# Patient Record
Sex: Male | Born: 1965 | Race: White | Hispanic: No | Marital: Single | Smoking: Current every day smoker
Health system: Southern US, Community
[De-identification: ages and names within clinical notes are randomized; demographics above are authoritative.]

## PROBLEM LIST (undated history)

## (undated) DIAGNOSIS — R569 Unspecified convulsions: Secondary | ICD-10-CM

## (undated) DIAGNOSIS — A879 Viral meningitis, unspecified: Secondary | ICD-10-CM

---

## 1998-09-01 ENCOUNTER — Encounter: Payer: Self-pay | Admitting: *Deleted

## 1998-09-01 ENCOUNTER — Emergency Department (HOSPITAL_COMMUNITY): Admission: EM | Admit: 1998-09-01 | Discharge: 1998-09-01 | Payer: Self-pay | Admitting: *Deleted

## 1998-09-03 ENCOUNTER — Ambulatory Visit (HOSPITAL_COMMUNITY): Admission: RE | Admit: 1998-09-03 | Discharge: 1998-09-03 | Payer: Self-pay | Admitting: *Deleted

## 1998-09-03 ENCOUNTER — Encounter: Payer: Self-pay | Admitting: *Deleted

## 1998-09-25 ENCOUNTER — Emergency Department (HOSPITAL_COMMUNITY): Admission: EM | Admit: 1998-09-25 | Discharge: 1998-09-25 | Payer: Self-pay | Admitting: Emergency Medicine

## 1998-09-26 ENCOUNTER — Inpatient Hospital Stay (HOSPITAL_COMMUNITY): Admission: AD | Admit: 1998-09-26 | Discharge: 1998-10-04 | Payer: Self-pay | Admitting: Psychiatry

## 1998-10-07 ENCOUNTER — Encounter (HOSPITAL_COMMUNITY): Admission: RE | Admit: 1998-10-07 | Discharge: 1999-01-05 | Payer: Self-pay | Admitting: Psychiatry

## 2001-02-26 ENCOUNTER — Emergency Department (HOSPITAL_COMMUNITY): Admission: EM | Admit: 2001-02-26 | Discharge: 2001-02-26 | Payer: Self-pay | Admitting: Emergency Medicine

## 2001-02-26 ENCOUNTER — Encounter: Payer: Self-pay | Admitting: Emergency Medicine

## 2003-07-11 ENCOUNTER — Emergency Department (HOSPITAL_COMMUNITY): Admission: EM | Admit: 2003-07-11 | Discharge: 2003-07-11 | Payer: Self-pay | Admitting: Emergency Medicine

## 2004-05-18 ENCOUNTER — Emergency Department (HOSPITAL_COMMUNITY): Admission: EM | Admit: 2004-05-18 | Discharge: 2004-05-19 | Payer: Self-pay | Admitting: Emergency Medicine

## 2004-05-22 ENCOUNTER — Ambulatory Visit (HOSPITAL_COMMUNITY): Admission: RE | Admit: 2004-05-22 | Discharge: 2004-05-22 | Payer: Self-pay | Admitting: Urology

## 2004-08-24 ENCOUNTER — Emergency Department (HOSPITAL_COMMUNITY): Admission: EM | Admit: 2004-08-24 | Discharge: 2004-08-24 | Payer: Self-pay | Admitting: Emergency Medicine

## 2004-09-18 ENCOUNTER — Ambulatory Visit: Payer: Self-pay | Admitting: Internal Medicine

## 2005-04-25 ENCOUNTER — Emergency Department (HOSPITAL_COMMUNITY): Admission: EM | Admit: 2005-04-25 | Discharge: 2005-04-25 | Payer: Self-pay | Admitting: Emergency Medicine

## 2006-04-09 ENCOUNTER — Emergency Department (HOSPITAL_COMMUNITY): Admission: EM | Admit: 2006-04-09 | Discharge: 2006-04-09 | Payer: Self-pay | Admitting: Emergency Medicine

## 2008-03-18 ENCOUNTER — Inpatient Hospital Stay (HOSPITAL_COMMUNITY): Admission: EM | Admit: 2008-03-18 | Discharge: 2008-03-21 | Payer: Self-pay | Admitting: Emergency Medicine

## 2008-03-18 ENCOUNTER — Ambulatory Visit: Payer: Self-pay | Admitting: Internal Medicine

## 2008-03-19 ENCOUNTER — Encounter (INDEPENDENT_AMBULATORY_CARE_PROVIDER_SITE_OTHER): Payer: Self-pay | Admitting: Hospitalist

## 2009-03-21 ENCOUNTER — Emergency Department (HOSPITAL_COMMUNITY): Admission: EM | Admit: 2009-03-21 | Discharge: 2009-03-21 | Payer: Self-pay | Admitting: Emergency Medicine

## 2011-03-03 NOTE — Discharge Summary (Signed)
NAMEMANLEY, FASON              ACCOUNT NO.:  [id number]   MEDICAL RECORD NO.:  [id number]          PATIENT TYPE:  INP   LOCATION:  3715                         FACILITY:  [facility]   PHYSICIAN:  Eliseo Gum, M.D.   DATE OF BIRTH:  1966-04-30   DATE OF ADMISSION:  03/18/2008  DATE OF DISCHARGE:  03/21/2008                               DISCHARGE SUMMARY   DISCHARGE DIAGNOSES:  1. Cardiomyopathy, ejection fraction 50%, mixed etiology.  2. Coronary artery disease.  3. History of polysubstance abuse.  4. History of alcohol abuse.  5. Tobacco abuse.  6. Left upper extremity weakness, resolved, normal MRI uptake.  7. Bilateral periorbital preseptal cellulitis.  8. Hypokalemia, resolved.  9. History of kidney disease.  10.History of chronic back pain secondary to a herniated disk.   DISCHARGE MEDICATIONS:  1. Aspirin 81 mg daily.  2. Zocor 20 mg daily.  3. Doxycycline 100 mg 3 times a day for 5 days.  4. Ciprofloxacin 500 mg twice a day for 5 days.   DISPOSITION AND FOLLOWUP:  The patient is sent home in a stable  condition.  The patient is advised to complete the antibiotic course for  5 days.  The patient advised to follow with his ophthalmologist and  refrain from using his contact lens until he is reviewed by his eye  doctor.  The patient will be follow up at the [facility] in terms of his primary care.  Followup appointment  has been arranged for April 03, 2008, with Dr. Polly Cobia.  At the followup  visit, the patient will be reviewed for resolution of his chest pain and  his primary care needs will be addressed.   PROCEDURES:  1. Chest x-ray, Mar 18, 2008.  Impression:  Low lung volume  2. CT of orbit/temporal with contrast. Impression:  No intraorbital      pathology identified.  Findings compatible with right periorbital      cellulitis.  No postseptal involvement.  3. MRI brain and C-spine without contrast of March 19, 2008.      Impression:  No  acute intracranial findings.  Mild multilevel      spondylosis.  4. Cardiac catheterization on March 21, 2008.  Findings:  Ejection      fraction 50%, mild global hypokinesia, mild diffuse disease of LAD   CONSULTS:  Dr. Mariah Milling of [facility].   ADMISSION HISTORY:  The patient is a 45 year old male with past medical  history significant for cocaine abuse, strong family history of coronary  artery disease, tobacco abuse, and heartburn who presented to the [facility] ED with complaint of crushing chest pain that started at  approximately 7 a.m. on the day of admission.  It happened after the  patient used crack cocaine.  History was given by the patient's mother.  The patient states that the pain was crushing, severe with radiation  down the left arm and into the jaw.  This pain was associated with  nausea and vomiting, dizziness, climate change per mother.  The patient  has had recent episodes of dizziness and  heartburn.  The patient's  mother reported that the patient also had increased cough and dizziness  that is not better with food.   ADMISSION PHYSICAL:  VITAL SIGNS:  Temperature 97.1, blood pressure  101/66, pulse 87, respiratory rate 18, and oxygen saturation 99% on room  air.  GENERAL:  The patient was drowsing having received Ativan, was unable to  open his eyes secondary to periorbital cellulitis.  EYES:  Bilateral erythematous conjunctivae with periorbital cellulitis.  ENT:  Moist mucous membranes with clear oropharynx.  NECK:  Supple with no carotid bruits.  CHEST:  Clear to auscultation bilaterally.  CARDIOVASCULAR:  Normal heart sounds.  Regular rate and rhythm.  No  murmur, rub, or gallop.  ABDOMEN:  Soft.  Positive bowel sounds, obese, nontender.  EXTREMITIES:  No edema.  SKIN:  Normal turgor.  No rashes.  LYMPHS:  No lymphadenopathy.  NEURO:  Limited exam secondary to Ativan.  Moving all 4 extremities.  Sensation is intact to light touch.    ADMISSION LABS:  Sodium 134, potassium 3.3, chloride 101, bicarbonate  36, BUN 9, creatinine 0.86, blood glucose 108.  Hemoglobin 15.2, MCV  19.3, WBC 9.4, ALT 6.8, and platelets 274,000.  INR 1.0.  Cardiac  enzymes and troponin less than 0.05.   HOSPITAL COURSE BY PROBLEMS:  1. Chest pain.  The patient did have significant risk factors for      acute coronary syndrome, coronary artery disease, and hence was      admitted for further workup and monitored.  He was placed on      telemetry floor and electrocardiogram were repeated and cardiac      enzymes were cycled.  He did not have any acute changes on the      EKGs, did not find ischemia and also cardiac enzymes were not      elevated, but given significant risk factor of a polysubstance      abuse, smoking, and family history of coronary artery disease, a      cardiac consultation was requested.  The patient was taken for a      stress Myoview examination, but on account of ongoing chest pain      that was cancelled and the patient subsequently underwent cardiac      catheterization March 21, 2008.  Findings are mentioned as above.      Following cardiac catheterization, cardiologist's recommendation      was to place the patient on statin and aspirin which were initiated      and the patient was sent home.  2. Periorbital cellulitis.  A CT scan of the head was done to rule out      intraorbital and intracranial extension of the infection.  The      patient was placed on vancomycin and Zosyn initially to follow up      for MRSA and Pseudomonas.  The patient is a contact lens user.  The      patient did not tolerate vancomycin, and developed itching and      rashes and hence was switched to doxycycline and Zosyn, followed by      doxycycline and ciprofloxacin p.o. upon discharge.  The patient      responded well to the antibiotic treatment.  He have also given      ofloxacin eye drops.  3. Left upper extremity weakness.  The patient did  not exhibit left      upper extremity weakness while in the hospital.  An MRI examination      was done to rule out acute stroke.  The MRI examination was normal,      and the left upper extremity weakness resolved completely.  4. Tobacco abuse.  The patient was given smoking cessation counseling.  5. Polysubstance abuse.  Urine drug screen was positive for opiates      and cocaine.  The patient was counseled on the need to remain off      the harmful drugs.  6. Hypokalemia, potassium was repleted.   DISCHARGE DAY LABS:  Sodium 139,  potassium 3.5, chloride 106, bicarb  27, BUN 6, creatinine 0.93, and glucose 156.  Hemoglobin 14.5, WBC 6.9,  platelets 234, and MCV 90.   DISCHARGE DAY VITALS:  Blood pressure 108/65, heart rate 87,  respirations 18, temperature 98.8, and oxygen saturation 97% on 2 L.   On the day of discharge, the patient was back to his baseline in terms  of functional status.  He was not complaining of any chest pain and he  was not complaining of any weakness.  His chest examination was  completely benign and neuro examination was completely nonfocal.      Zara Council, MD  Electronically Signed      Eliseo Gum, M.D.  Electronically Signed    AS/MEDQ  D:  03/21/2008  T:  03/22/2008  Job:  [id number]

## 2011-03-03 NOTE — Cardiovascular Report (Signed)
Bruce Wells, Bruce Wells              ACCOUNT NO.:  [id number]   MEDICAL RECORD NO.:  [id number]          PATIENT TYPE:  INP   LOCATION:  3715                         FACILITY:  [facility]   PHYSICIAN:  Antonieta Iba, MD   DATE OF BIRTH:  08/31/1966   DATE OF PROCEDURE:  03/21/2008  DATE OF DISCHARGE:  03/21/2008                            CARDIAC CATHETERIZATION   PHYSICIAN PERFORMED THE PROCEDURE:  Antonieta Iba, MD.   REASON FOR PROCEDURE:  Bruce Wells is a 45 year old gentleman with strong  family history of coronary artery disease with past medical history of  crack cocaine use and chronic back pain secondary to herniated disk, who  presented with chest pain, nausea, vomiting, and diaphoresis in the  setting of recent crack cocaine use.  He also reports having left arm  numbness and pain radiating to the left jaw.  Initial presentation was  on Mar 18, 2008.  His cardiac enzymes were negative.  He was sent for  cardiac catheterization given his presentation and strong family history  of coronary artery disease.   PROCEDURE:  Details of the risks and benefits of the procedure were  detailed to Bruce Wells and consent was obtained.  He was brought to the  cardiac catheterization lab and prepped and draped in the usual sterile  fashion.  The right and left groin were prepped and right femoral  arterial access was obtained using modified Seldinger technique.  A 5  French introducer sheath was inserted into the femoral artery.  A  Judkins left #4 catheter 5-French and a Judkins right #4 catheter 5-  [place] were used to evaluate the left main and right coronary arteries  respectively.  A non-torque 5-French Judkins catheter was used secondary  to difficulty engaging the ostium of the RCA.  Hand injection of  contrast was performed with fluoroscopy and images were evaluated.  At  the end of the procedure, the catheter was removed and hemostasis was  obtained with pressure.  No  complications were reported.   CORONARY ANATOMY DETAILS:  1. Left main;  Left main is a moderate-sized vessel that bifurcates      into the LAD and left circumflex artery.  It is a moderate-to-large      size vessel that is short in nature.  There is no significant      disease noted.  2. Left anterior descending; the LAD is a moderate-to-large size      vessel that extends distally to the apex.  There are 2 moderate-      sized diagonal vessels that takeoff proximally.  There is mild 20-      30 diffuse disease of the mid LAD after the takeoff of the second      diagonal.  This region is small in caliber than the proximal and      even the further distal LAD region.  No other significant disease      is noted in the diagonals or mid to distal LAD.  3. Left circumflex; the left circumflex is a moderate-sized codominant      vessel that has  several obtuse marginal branches of moderate size.      There is no significant disease noted.  4. Right coronary artery; the RCA was difficult to evaluate, though      nonselective shots clearly showed no significant coronary disease.      It is of moderate size, though caliber is on the smaller side.      There is a visible PDA and PL branch.  There is no significant      disease noted.   LV gram shows low normal systolic function with mild global hypokinesis  and ejection fraction estimated at 50%.  There is no evidence of  significant aortic stenosis or mitral regurgitation.   SUMMARY:  Codominant coronary system. Mild diffuse disease of the mid  LAD with small caliber vessel in this region, otherwise no significant  coronary artery disease. Low normal systolic function with estimated  ejection fraction of 50%.  No significant aortic stenosis noted.  Etiology of the patient's symptoms is likely secondary to cocaine and  possible coronary spasm.      Antonieta Iba, MD  Electronically Signed     TJG/MEDQ  D:  03/21/2008  T:   03/22/2008  Job:  [id number]

## 2011-07-15 LAB — BASIC METABOLIC PANEL
BUN: 9
Calcium: 9.1
Chloride: 101
Creatinine, Ser: 0.86
GFR calc Af Amer: >60
GFR calc non Af Amer: >60
Sodium: 134 — ABNORMAL LOW

## 2011-07-15 LAB — CBC
Hemoglobin: 15.2
MCV: 90.3
RBC: 4.83

## 2011-07-15 LAB — COMPREHENSIVE METABOLIC PANEL
Alkaline Phosphatase: 54
CO2: 26
Chloride: 104
Creatinine, Ser: 0.86
GFR calc Af Amer: >60
GFR calc non Af Amer: >60
Glucose, Bld: 106 — ABNORMAL HIGH
Potassium: 3.3 — ABNORMAL LOW
Sodium: 135
Total Protein: 5.8 — ABNORMAL LOW

## 2011-07-15 LAB — RAPID URINE DRUG SCREEN, HOSP PERFORMED
Amphetamines: NOT DETECTED
Barbiturates: NOT DETECTED
Opiates: POSITIVE — AB

## 2011-07-15 LAB — CK TOTAL AND CKMB (NOT AT ARMC)
CK, MB: 1
Relative Index: INVALID

## 2011-07-15 LAB — POCT CARDIAC MARKERS
CKMB, poc: <1 — ABNORMAL LOW
Operator id: 265201

## 2011-07-15 LAB — APTT: aPTT: 29

## 2011-07-15 LAB — HEMOGLOBIN A1C: Hgb A1c MFr Bld: 5.6

## 2011-07-15 LAB — DIFFERENTIAL
Eosinophils Relative: 3
Lymphocytes Relative: 15
Lymphs Abs: 1.4

## 2011-07-15 LAB — MAGNESIUM: Magnesium: 2.3

## 2011-07-15 LAB — URINALYSIS, ROUTINE W REFLEX MICROSCOPIC
Hgb urine dipstick: NEGATIVE
Nitrite: NEGATIVE
Specific Gravity, Urine: 1.017
Urobilinogen, UA: 1

## 2011-07-15 LAB — PROTIME-INR: INR: 1

## 2011-07-15 LAB — CARDIAC PANEL(CRET KIN+CKTOT+MB+TROPI)
Relative Index: INVALID
Troponin I: 0.02

## 2011-07-16 LAB — CBC
HCT: 41.8
HCT: 44.6
Hemoglobin: 14.5
Hemoglobin: 15
MCHC: 34.7
MCHC: 34.7
MCV: 90.3
Platelets: 234
Platelets: 234
RBC: 4.63
RDW: 13
WBC: 5.9
WBC: 6.9

## 2011-07-16 LAB — BASIC METABOLIC PANEL
BUN: 5 — ABNORMAL LOW
BUN: 6
CO2: 27
Calcium: 8.8
Chloride: 106
Chloride: 109
Creatinine, Ser: 0.93
GFR calc Af Amer: >60
GFR calc Af Amer: >60
GFR calc non Af Amer: >60
GFR calc non Af Amer: >60
GFR calc non Af Amer: >60
Potassium: 3.5
Potassium: 3.7
Potassium: 3.8
Potassium: 4.2
Sodium: 137
Sodium: 140
Sodium: 142

## 2011-07-16 LAB — CARDIAC PANEL(CRET KIN+CKTOT+MB+TROPI)
CK, MB: 0.7
Total CK: 53
Troponin I: <0.01

## 2011-07-16 LAB — LIPID PANEL
Total CHOL/HDL Ratio: 7.7
VLDL: 55 — ABNORMAL HIGH

## 2013-01-15 ENCOUNTER — Encounter (HOSPITAL_COMMUNITY): Payer: Self-pay | Admitting: Nurse Practitioner

## 2013-01-15 ENCOUNTER — Emergency Department (HOSPITAL_COMMUNITY)
Admission: EM | Admit: 2013-01-15 | Discharge: 2013-01-15 | Disposition: A | Discharge status: Home / Self Care | Payer: Self-pay | Attending: Emergency Medicine | Admitting: Emergency Medicine

## 2013-01-15 ENCOUNTER — Emergency Department (HOSPITAL_COMMUNITY): Payer: Self-pay

## 2013-01-15 DIAGNOSIS — R109 Unspecified abdominal pain: Secondary | ICD-10-CM

## 2013-01-15 DIAGNOSIS — R079 Chest pain, unspecified: Secondary | ICD-10-CM | POA: Insufficient documentation

## 2013-01-15 DIAGNOSIS — R05 Cough: Secondary | ICD-10-CM | POA: Insufficient documentation

## 2013-01-15 DIAGNOSIS — F17200 Nicotine dependence, unspecified, uncomplicated: Secondary | ICD-10-CM | POA: Insufficient documentation

## 2013-01-15 DIAGNOSIS — R059 Cough, unspecified: Secondary | ICD-10-CM | POA: Insufficient documentation

## 2013-01-15 DIAGNOSIS — R1011 Right upper quadrant pain: Secondary | ICD-10-CM | POA: Insufficient documentation

## 2013-01-15 LAB — COMPREHENSIVE METABOLIC PANEL
AST: 27 U/L (ref 0–37)
Albumin: 4.2 g/dL (ref 3.5–5.2)
Alkaline Phosphatase: 63 U/L (ref 39–117)
BUN: 10 mg/dL (ref 6–23)
Chloride: 98 mEq/L (ref 96–112)
Potassium: 3.8 mEq/L (ref 3.5–5.1)
Total Bilirubin: 0.4 mg/dL (ref 0.3–1.2)
Total Protein: 7.6 g/dL (ref 6.0–8.3)

## 2013-01-15 LAB — CBC WITH DIFFERENTIAL/PLATELET
Eosinophils Absolute: 0.2 10*3/uL (ref 0.0–0.7)
Eosinophils Relative: 2 % (ref 0–5)
HCT: 45.6 % (ref 39.0–52.0)
Lymphocytes Relative: 17 % (ref 12–46)
Lymphs Abs: 1.8 10*3/uL (ref 0.7–4.0)
MCH: 31.3 pg (ref 26.0–34.0)
MCV: 87.5 fL (ref 78.0–100.0)
Monocytes Absolute: 0.8 10*3/uL (ref 0.1–1.0)
Platelets: 317 10*3/uL (ref 150–400)
RBC: 5.21 MIL/uL (ref 4.22–5.81)
RDW: 13.9 % (ref 11.5–15.5)
WBC: 10.8 10*3/uL — ABNORMAL HIGH (ref 4.0–10.5)

## 2013-01-15 LAB — URINALYSIS, MICROSCOPIC ONLY
Bilirubin Urine: NEGATIVE
Glucose, UA: NEGATIVE mg/dL
Hgb urine dipstick: NEGATIVE
Ketones, ur: NEGATIVE mg/dL
Nitrite: NEGATIVE
Specific Gravity, Urine: 1.019 (ref 1.005–1.030)
pH: 7.5 (ref 5.0–8.0)

## 2013-01-15 MED ORDER — HYDROMORPHONE HCL PF 1 MG/ML IJ SOLN
1.0000 mg | Freq: Once | INTRAMUSCULAR | Status: AC
  Administered 2013-01-15: 1 mg via INTRAVENOUS
  Filled 2013-01-15: qty 1

## 2013-01-15 MED ORDER — ONDANSETRON HCL 4 MG/2ML IJ SOLN
4.0000 mg | Freq: Once | INTRAMUSCULAR | Status: AC
  Administered 2013-01-15: 4 mg via INTRAVENOUS
  Filled 2013-01-15: qty 2

## 2013-01-15 MED ORDER — IBUPROFEN 600 MG PO TABS: 600.0000 mg | ORAL_TABLET | Freq: Three times a day (TID) | ORAL | Status: DC | PRN

## 2013-01-15 MED ORDER — SODIUM CHLORIDE 0.9 % IV SOLN: 1000.0000 mL | INTRAVENOUS | Status: DC

## 2013-01-15 MED ORDER — SODIUM CHLORIDE 0.9 % IV SOLN
1000.0000 mL | Freq: Once | INTRAVENOUS | Status: AC
  Administered 2013-01-15: 1000 mL via INTRAVENOUS

## 2013-01-15 MED ORDER — HYDROCODONE-ACETAMINOPHEN 5-325 MG PO TABS: 1.0000 | ORAL_TABLET | ORAL | Status: DC | PRN

## 2013-01-15 NOTE — ED Provider Notes (Signed)
History     CSN: [id number]  Arrival date & time 01/15/13  1047   First MD Initiated Contact with Patient 01/15/13 1326      Chief Complaint  Patient presents with  . Abdominal Pain    HPI The patient reports 10 days of right-sided chest pain is worse with deep breathing and coughing.  He states his pain became severe today when he coughed very hard and had significant right-sided chest pain.  He reports some nausea over the past several days without vomiting.  No fevers or chills.  No productive cough.  He has no history of gallstones.  Is there history of COPD.  He continues to smoke cigarettes.  No history of DVT or pulmonary embolism.  No cardiac history.  He states his symptoms are mild to moderate in severity and worsened with coughing at which point the pain becomes severe.  No rash noted.   History reviewed. No pertinent past medical history.  History reviewed. No pertinent past surgical history.  History reviewed. No pertinent family history.  History  Substance Use Topics  . Smoking status: Current Every Day Smoker  . Smokeless tobacco: Not on file  . Alcohol Use: No      Review of Systems  All other systems reviewed and are negative.    Allergies  Sulfa antibiotics  Home Medications  No current outpatient prescriptions on file.  BP 120/77  Pulse 81  Temp(Src) 97.9 F (36.6 C) (Oral)  Resp 20  SpO2 98%  Physical Exam  Nursing note and vitals reviewed. Constitutional: He is oriented to person, place, and time. He appears well-developed and well-nourished.  HENT:  Head: Normocephalic and atraumatic.  Eyes: EOM are normal.  Neck: Normal range of motion.  Cardiovascular: Normal rate, regular rhythm, normal heart sounds and intact distal pulses.   Pulmonary/Chest: Effort normal and breath sounds normal. No respiratory distress. He exhibits no tenderness.  Right anterolateral chest wall tenderness without ecchymosis.  Abdominal: Soft. He exhibits no  distension and no mass.  Right upper quadrant tenderness without guarding or rebound, very mild  Musculoskeletal: Normal range of motion.  Neurological: He is alert and oriented to person, place, and time.  Skin: Skin is warm and dry.  Psychiatric: He has a normal mood and affect. Judgment normal.    ED Course  Procedures (including critical care time)  Labs Reviewed  COMPREHENSIVE METABOLIC PANEL - Abnormal; Notable for the following:    Sodium 134 (*)    Glucose, Bld 104 (*)    All other components within normal limits  CBC WITH DIFFERENTIAL - Abnormal; Notable for the following:    WBC 10.8 (*)    Neutro Abs 8.0 (*)    All other components within normal limits  LIPASE, BLOOD  URINALYSIS, MICROSCOPIC ONLY   Dg Chest 2 View  01/15/2013  *RADIOLOGY REPORT*  Clinical Data: Right chest pain, cough  CHEST - 2 VIEW  Comparison: 03/18/2008  Findings: Normal heart size and vascularity.  Minimal basilar atelectasis.  No CHF, pneumonia, effusion or pneumothorax.  Trachea midline.  IMPRESSION: Basilar atelectasis.  No acute finding.   Original Report Authenticated By: Judie Petit. Miles Costain, M.D.    US Abdomen Complete  01/15/2013  *RADIOLOGY REPORT*  Clinical Data:  Abdominal pain.  COMPLETE ABDOMINAL ULTRASOUND  Comparison:  Abdominal CT 04/09/2006  Findings:  Gallbladder:  No gallstones, gallbladder wall thickening, or pericholecystic fluid.  Common bile duct:  Measures 4 mm.  Liver:  The liver parenchyma is slightly  echogenic with loss of internal architecture. Findings suggest hepatic steatosis.  IVC:  Not visualized.  Pancreas:  Not visualized.  Spleen:  Measures 6.9 cm in length.  Right Kidney:  Right kidney measures 12.6 cm in length without hydronephrosis.  Left Kidney:  Left kidney measures 13.1 cm without hydronephrosis.  Abdominal aorta:  The mid and distal aorta are not visualized.  IMPRESSION:  No acute abnormalities.  The liver parenchyma is slightly echogenic.  Findings can be associated with  hepatic steatosis.   Original Report Authenticated By: Richarda Overlie, M.D.    I personally reviewed the imaging tests through PACS system I reviewed available ER/hospitalization records through the EMR   1. Abdominal pain       MDM  Chest x-ray and ultrasound.  This may represent cholecystitis given the severity of his upper quadrant pain.  Vital signs are normal.  Pain will be treated.  Labs pending.  May also represent chest wall pain secondary to coughing.  4:01 PM Patient feels much better this time.  He has no gallstones.  His vital signs are normal.  No hypoxia or tachycardia.  Her represent pleurisy versus chest wall pain from coughing.  Doubt pulmonary embolism.  With pain medicine and followup with his primary care physician .  He understands return to the ER for new or worsening symptoms        Lyanne Co, MD 01/15/13 [phone]

## 2013-01-15 NOTE — ED Notes (Addendum)
Pt reports RUQ/R side pain for past 10 days. Today he was having a BM and he felt a "pop" in this area, states "It felt like something ripped." c/o severe pain and nausea now. No v/d. A&Ox4.

## 2013-09-26 IMAGING — US US ABDOMEN COMPLETE
1 series · 14 of 25 positions shown · non-contrast
Comparison: Abdominal CT 04/09/2006

CLINICAL DATA: Abdominal pain.

COMPLETE ABDOMINAL ULTRASOUND

[Series 1: us abdomen complete · 0.33mm/px · 14 of 53 slices shown]
[im 1/53]
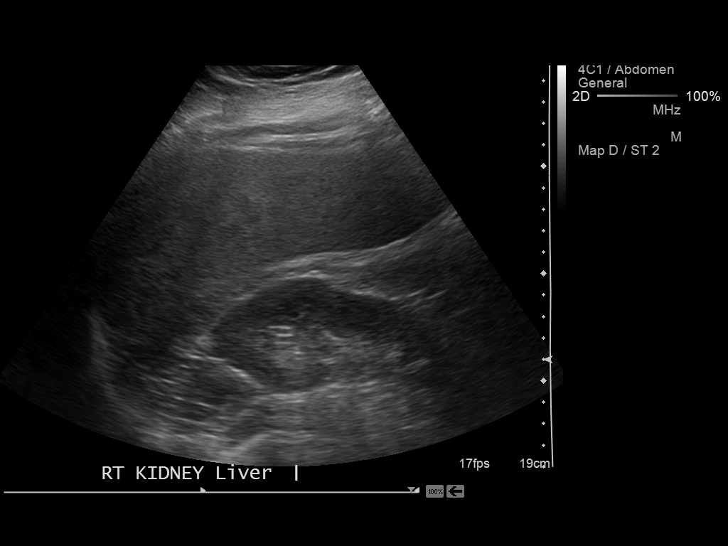
[im 5/53]
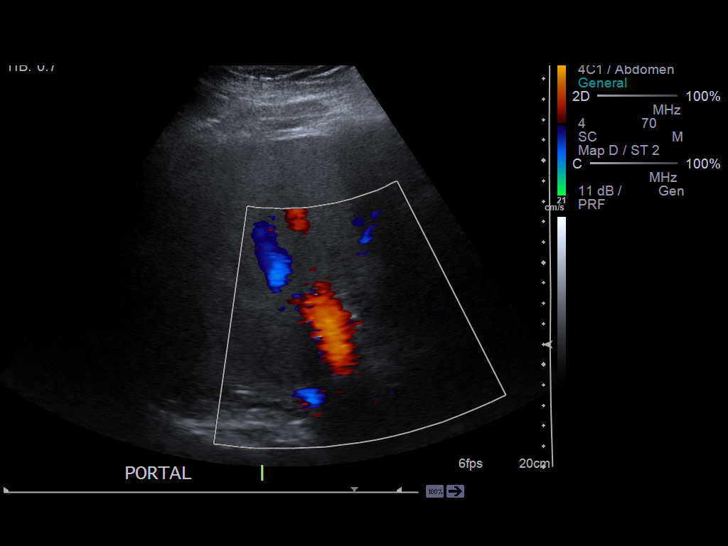
[im 9/53]
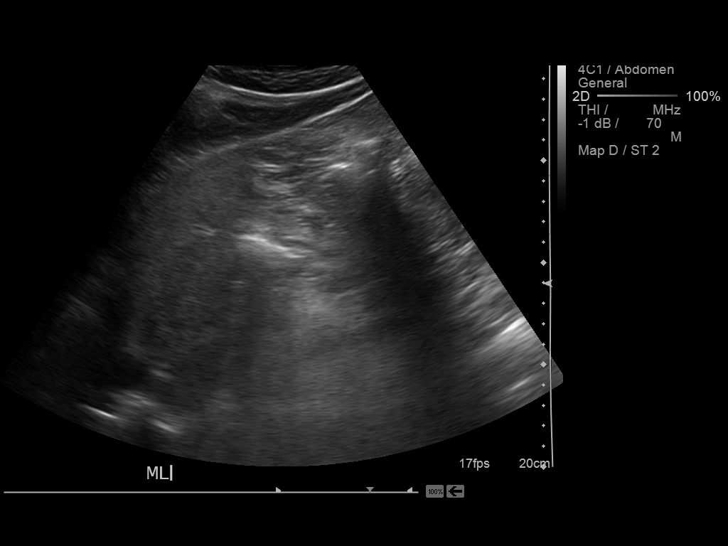
[im 14/53]
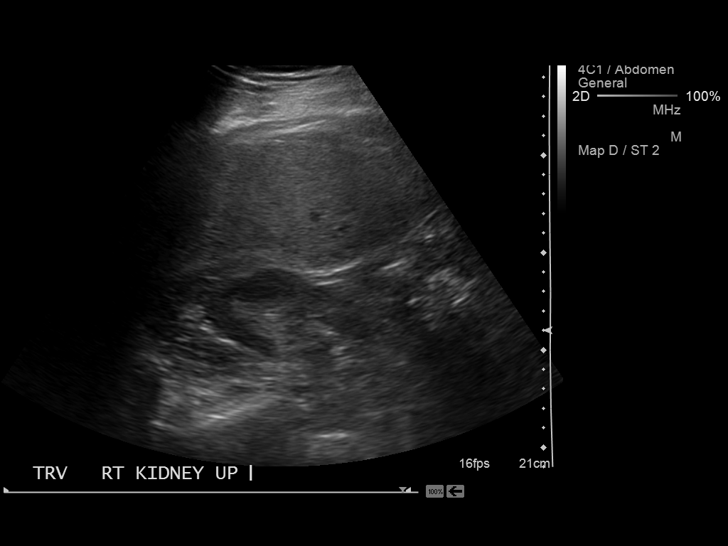
[im 18/53]
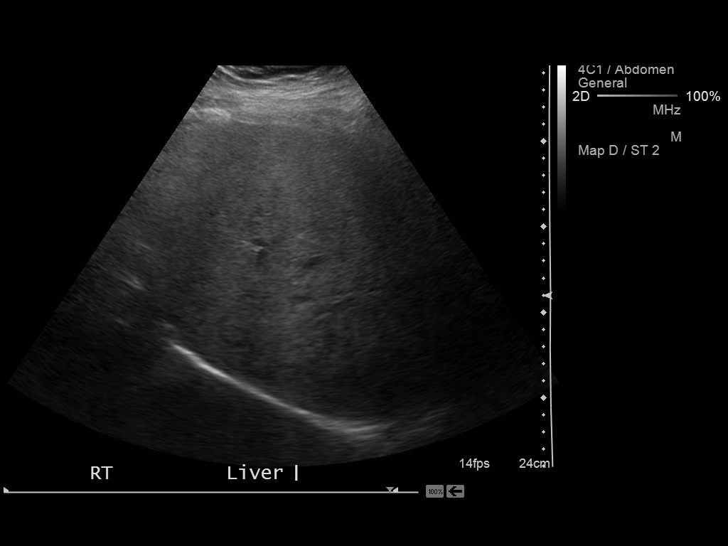
[im 20/53]
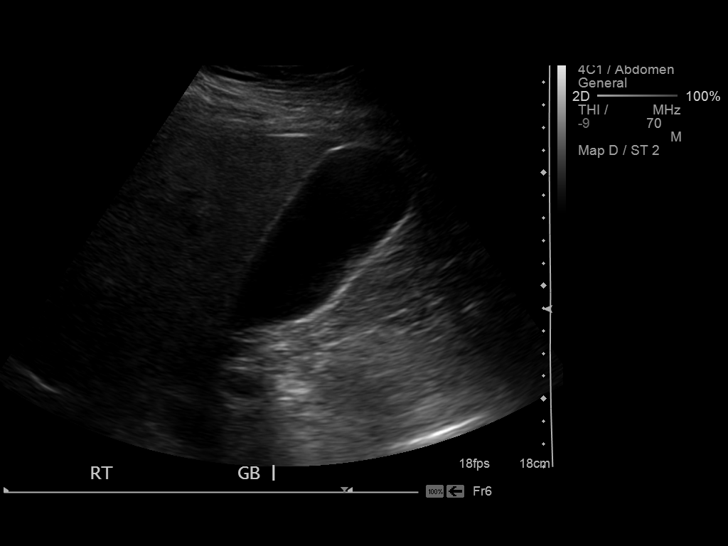
[im 24/53]
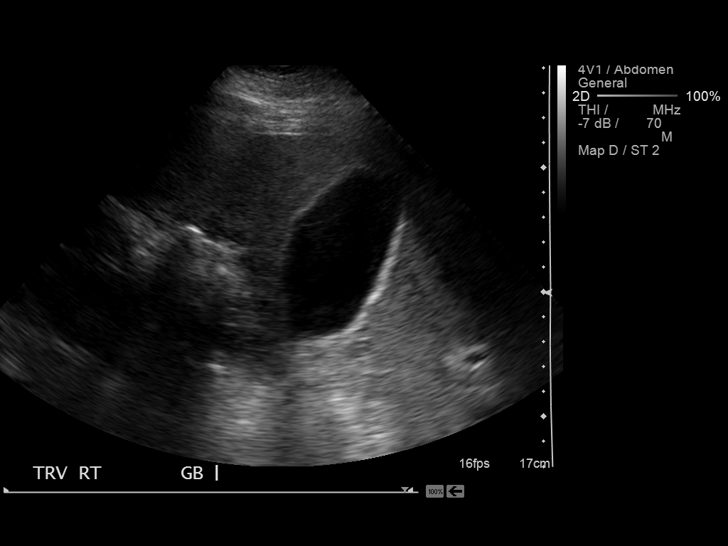
[im 29/53]
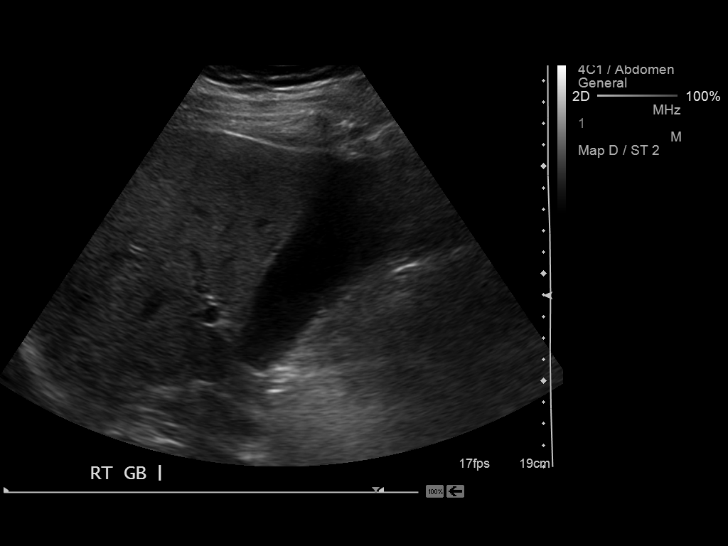
[im 33/53]
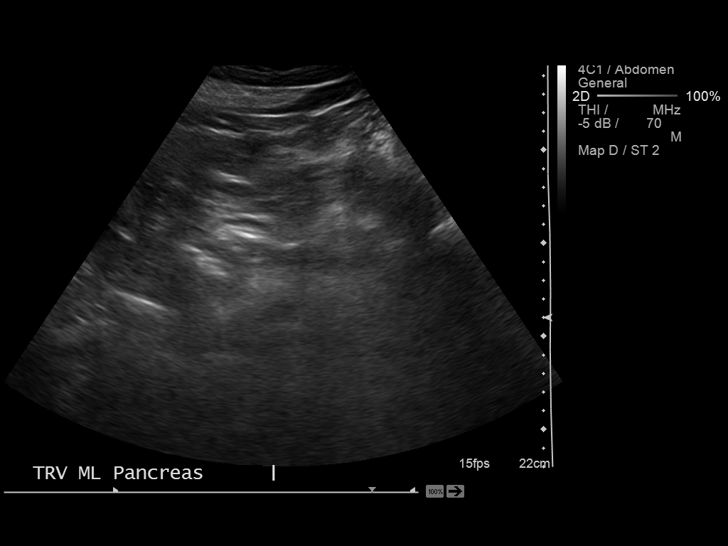
[im 35/53]
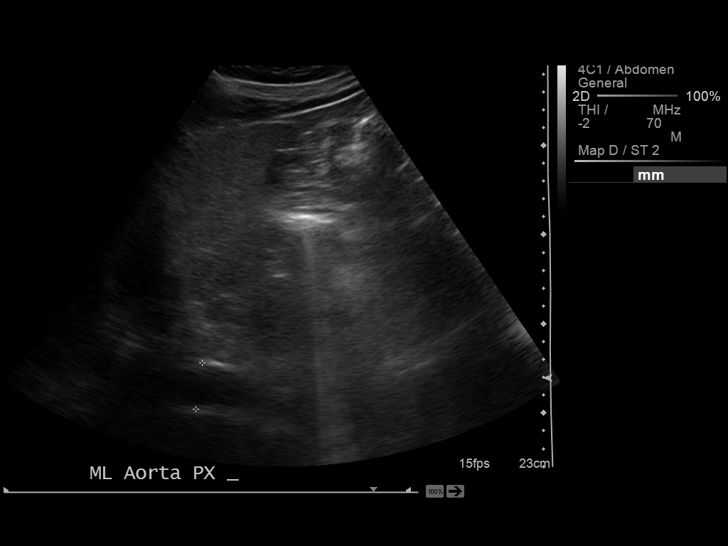
[im 40/53]
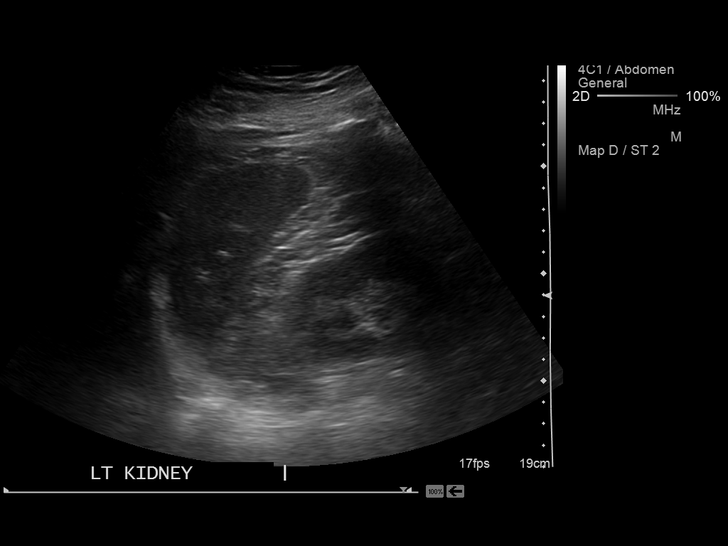
[im 44/53]
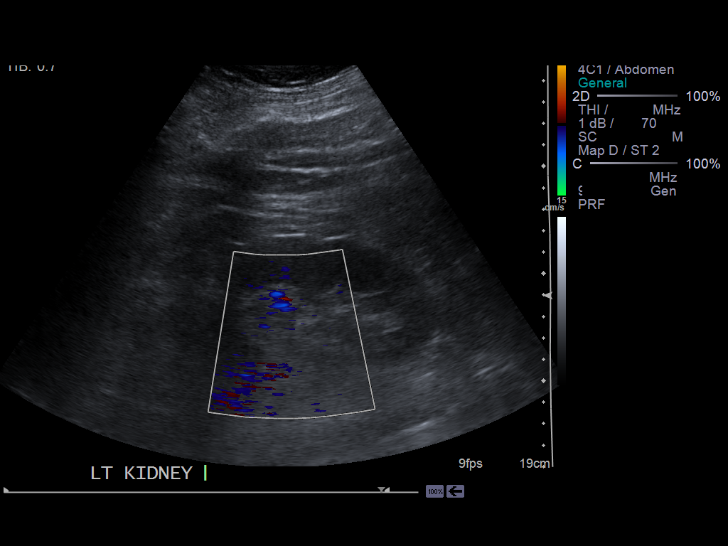
[im 48/53]
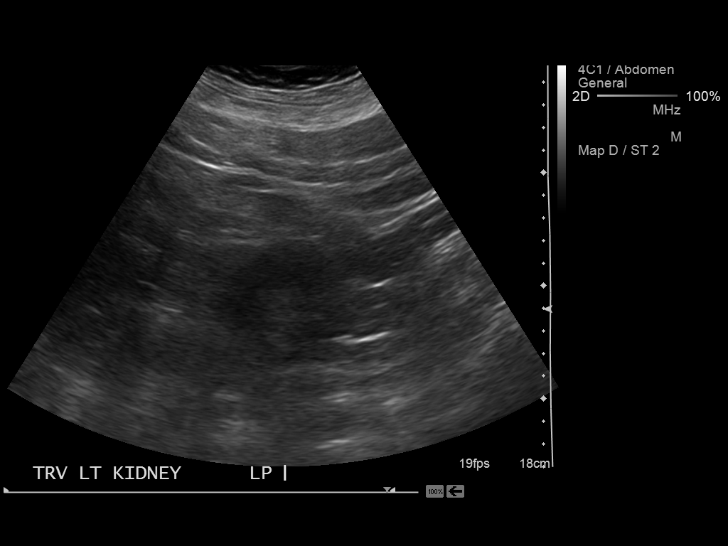
[im 53/53]
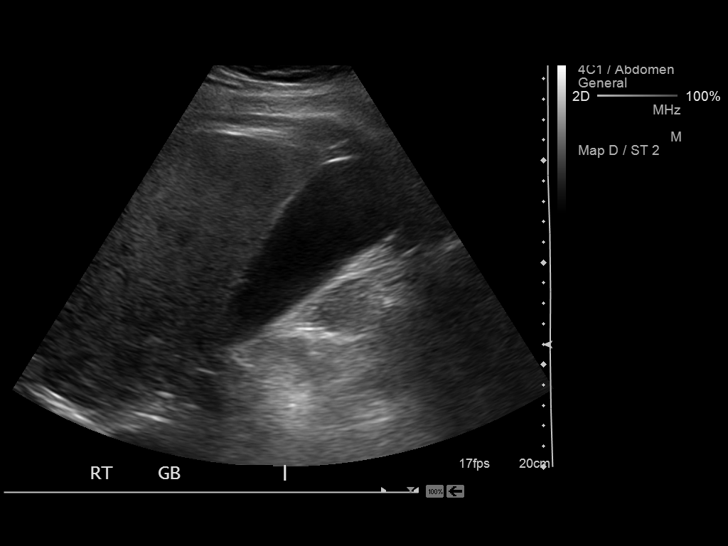

[14 of 25 positions shown; findings below may reference images not displayed]

FINDINGS: Gallbladder:  No gallstones, gallbladder wall thickening, or
pericholecystic fluid.

Common bile duct:  Measures 4 mm.

Liver:  The liver parenchyma is slightly echogenic with loss of
internal architecture. Findings suggest hepatic steatosis.

IVC:  Not visualized.

Pancreas:  Not visualized.

Spleen:  Measures 6.9 cm in length.

Right Kidney:  Right kidney measures 12.6 cm in length without
hydronephrosis.

Left Kidney:  Left kidney measures 13.1 cm without hydronephrosis.

Abdominal aorta:  The mid and distal aorta are not visualized.
IMPRESSION: No acute abnormalities.

The liver parenchyma is slightly echogenic.  Findings can be
associated with hepatic steatosis.

## 2021-07-30 ENCOUNTER — Emergency Department (HOSPITAL_COMMUNITY)
Admission: EM | Admit: 2021-07-30 | Discharge: 2021-07-30 | Disposition: A | Discharge status: Home / Self Care | Payer: Self-pay | Attending: Emergency Medicine | Admitting: Emergency Medicine

## 2021-07-30 ENCOUNTER — Encounter (HOSPITAL_COMMUNITY): Payer: Self-pay

## 2021-07-30 ENCOUNTER — Emergency Department (HOSPITAL_COMMUNITY): Payer: Self-pay

## 2021-07-30 ENCOUNTER — Other Ambulatory Visit: Payer: Self-pay

## 2021-07-30 DIAGNOSIS — R079 Chest pain, unspecified: Secondary | ICD-10-CM

## 2021-07-30 DIAGNOSIS — F17290 Nicotine dependence, other tobacco product, uncomplicated: Secondary | ICD-10-CM | POA: Insufficient documentation

## 2021-07-30 DIAGNOSIS — R0789 Other chest pain: Secondary | ICD-10-CM | POA: Insufficient documentation

## 2021-07-30 HISTORY — DX: Unspecified convulsions: R56.9

## 2021-07-30 HISTORY — DX: Viral meningitis, unspecified: A87.9

## 2021-07-30 LAB — CBC
HCT: 51.8 % (ref 39.0–52.0)
Hemoglobin: 17.7 g/dL — ABNORMAL HIGH (ref 13.0–17.0)
MCH: 33 pg (ref 26.0–34.0)
MCHC: 34.2 g/dL (ref 30.0–36.0)
MCV: 96.5 fL (ref 80.0–100.0)
Platelets: 236 10*3/uL (ref 150–400)
RBC: 5.37 MIL/uL (ref 4.22–5.81)
RDW: 14.5 % (ref 11.5–15.5)
WBC: 6.6 10*3/uL (ref 4.0–10.5)
nRBC: 0 % (ref 0.0–0.2)

## 2021-07-30 LAB — BASIC METABOLIC PANEL
Anion gap: 7 (ref 5–15)
BUN: 12 mg/dL (ref 6–20)
CO2: 25 mmol/L (ref 22–32)
Calcium: 9.2 mg/dL (ref 8.9–10.3)
Chloride: 105 mmol/L (ref 98–111)
Creatinine, Ser: 0.73 mg/dL (ref 0.61–1.24)
GFR, Estimated: >60 mL/min (ref >60)
Glucose, Bld: 86 mg/dL (ref 70–99)
Potassium: 4.3 mmol/L (ref 3.5–5.1)
Sodium: 137 mmol/L (ref 135–145)

## 2021-07-30 LAB — TROPONIN I (HIGH SENSITIVITY)
Troponin I (High Sensitivity): <2 ng/L (ref <18)
Troponin I (High Sensitivity): <2 ng/L (ref <18)

## 2021-07-30 MED ORDER — ASPIRIN 81 MG PO CHEW
324.0000 mg | CHEWABLE_TABLET | Freq: Once | ORAL | Status: AC
  Administered 2021-07-30: 324 mg via ORAL
  Filled 2021-07-30: qty 4

## 2021-07-30 NOTE — ED Triage Notes (Signed)
Patient reports intermittent chest pain on the left chest and states he has had pain in his left arm, left. Patient also c/o fatigue. Patient denies SOB.

## 2021-07-30 NOTE — ED Provider Notes (Signed)
[facility]-EMERGENCY DEPT Provider Note   CSN: [id number] Arrival date & time: 07/30/21  1334     History Chief Complaint  Patient presents with   Chest Pain    Bruce Wells is a 55 y.o. male.   Chest Pain  HPI: A 55 year old patient with a history of hypercholesterolemia and obesity presents for evaluation of chest pain. Initial onset of pain was more than 6 hours ago. The patient's chest pain is described as heaviness/pressure/tightness and is worse with exertion. The patient's chest pain is middle- or left-sided, is not well-localized, is not sharp and does radiate to the arms/jaw/neck. The patient does not complain of nausea and denies diaphoresis. The patient has smoked in the past 90 days and has a family history of coronary artery disease in a first-degree relative with onset less than age 27. The patient has no history of stroke, has no history of peripheral artery disease, denies any history of treated diabetes and is not hypertensive.  Patient states he has had several episodes starting last couple of days.  Each episode is brief lasting less than a minute and then he will have another period where he is fine for maybe an hour or so.  He became more concerned when he started having a pain rating to his arm and his jaw.  He also noted that he had decreased exercise tolerance today Past Medical History:  Diagnosis Date   Seizures (HCC)    Viral meningitis     There are no problems to display for this patient.   History reviewed. No pertinent surgical history.     History reviewed. No pertinent family history.  Social History   Tobacco Use   Smoking status: Every Day    Types: Cigars   Smokeless tobacco: Never  Vaping Use   Vaping Use: Never used  Substance Use Topics   Alcohol use: No   Drug use: Yes    Types: Marijuana    Home Medications Prior to Admission medications   Medication Sig Start Date End Date Taking? Authorizing  Provider  acetaminophen (TYLENOL) 500 MG tablet Take 1,000 mg by mouth every 6 (six) hours as needed for pain.    [provider]  HYDROcodone-acetaminophen (NORCO/VICODIN) 5-325 MG per tablet Take 1 tablet by mouth every 4 (four) hours as needed for pain. 01/15/13   Azalia Bilis, MD  ibuprofen (ADVIL,MOTRIN) 600 MG tablet Take 1 tablet (600 mg total) by mouth every 8 (eight) hours as needed for pain. 01/15/13   Azalia Bilis, MD  pseudoephedrine (SUDAFED) 30 MG tablet Take 30 mg by mouth every 4 (four) hours as needed for congestion.    [provider]    Allergies    Sulfa antibiotics  Review of Systems   Review of Systems  Cardiovascular:  Positive for chest pain.  All other systems reviewed and are negative.  Physical Exam Updated Vital Signs BP 129/82   Pulse 67   Temp 98.3 F (36.8 C) (Oral)   Resp 15   Ht 1.778 m (5\' 10" )   Wt 108.9 kg   SpO2 95%   BMI 34.44 kg/m   Physical Exam Vitals and nursing note reviewed.  Constitutional:      General: He is not in acute distress.    Appearance: He is well-developed.  HENT:     Head: Normocephalic and atraumatic.     Right Ear: External ear normal.     Left Ear: External ear normal.  Eyes:  General: No scleral icterus.       Right eye: No discharge.        Left eye: No discharge.     Conjunctiva/sclera: Conjunctivae normal.  Neck:     Trachea: No tracheal deviation.  Cardiovascular:     Rate and Rhythm: Normal rate and regular rhythm.  Pulmonary:     Effort: Pulmonary effort is normal. No respiratory distress.     Breath sounds: Normal breath sounds. No stridor. No wheezing or rales.  Abdominal:     General: Bowel sounds are normal. There is no distension.     Palpations: Abdomen is soft.     Tenderness: There is no abdominal tenderness. There is no guarding or rebound.  Musculoskeletal:        General: No tenderness or deformity.     Cervical back: Neck supple.  Skin:    General: Skin is warm  and dry.     Findings: No rash.  Neurological:     General: No focal deficit present.     Mental Status: He is alert.     Cranial Nerves: No cranial nerve deficit (no facial droop, extraocular movements intact, no slurred speech).     Sensory: No sensory deficit.     Motor: No abnormal muscle tone or seizure activity.     Coordination: Coordination normal.  Psychiatric:        Mood and Affect: Mood normal.    ED Results / Procedures / Treatments   Labs (all labs ordered are listed, but only abnormal results are displayed) Labs Reviewed  CBC - Abnormal; Notable for the following components:      Result Value   Hemoglobin 17.7 (*)    All other components within normal limits  BASIC METABOLIC PANEL  TROPONIN I (HIGH SENSITIVITY)  TROPONIN I (HIGH SENSITIVITY)    EKG EKG Interpretation  Date/Time:  Wednesday July 30 2021 14:10:19 EDT Ventricular Rate:  67 PR Interval:  231 QRS Duration: 77 QT Interval:  373 QTC Calculation: 394 R Axis:   45 Text Interpretation: Sinus rhythm Prolonged PR interval Since last tracing PR interval is longer Otherwise no significant change Confirmed by Mancel Bale [phone]) on 07/30/2021 4:19:30 PM  Radiology DG Chest 2 View  Result Date: 07/30/2021 CLINICAL DATA:  Chest pain EXAM: CHEST - 2 VIEW COMPARISON:  Chest radiograph 01/15/2013 FINDINGS: The cardiomediastinal silhouette is within normal limits. There is no focal consolidation or pulmonary edema. There is no pleural effusion or pneumothorax. There is no acute osseous abnormality. IMPRESSION: No radiographic evidence of acute cardiopulmonary process. Electronically Signed   By: Lesia Hausen M.D.   On: 07/30/2021 15:42    Procedures Procedures   Medications Ordered in ED Medications  aspirin chewable tablet 324 mg (has no administration in time range)    ED Course  I have reviewed the triage vital signs and the nursing notes.  Pertinent labs & imaging results that were available  during my care of the patient were reviewed by me and considered in my medical decision making (see chart for details).  Clinical Course as of 07/30/21 2144  Wed Jul 30, 2021  1731 ECG reviewed [JK]  1731 CXR negative [JK]  2121 Case discussed with Dr Lendell Caprice, cardiology.  Feels pt would be a good candidate for a coronary CT [JK]    Clinical Course User Index [JK] Linwood Dibbles, MD   MDM Rules/Calculators/A&P HEAR Score: 5  presented to the ED with complaints of chest pain.  Patient's presentation is concerning for the possibility of new onset unstable angina.  Does have risk factors of smoking and family history.  I discussed admitting the patient to the hospital for cardiac evaluation and likely a cardiac CT scan in the morning.  Patient is agreeable to the evaluation however he has pets that he needs to take care of.  Patient states he lives in his [place].  Currently he has the cats in his storage unit while he has been in the hospital.  They have food and water but they can stay there overnight.  He needs to go take care of his animals but then will return tomorrow.  He understands to return immediately if he has any recurrent pain.  I will also give him the name of cardiology in case he does not return tomorrow. Final Clinical Impression(s) / ED Diagnoses Final diagnoses:  Chest pain, unspecified type    Rx / DC Orders ED Discharge Orders     None        Linwood Dibbles, MD 07/30/21 2144

## 2021-07-30 NOTE — ED Provider Notes (Signed)
Emergency Medicine Provider Triage Evaluation Note  Bruce Wells , a 55 y.o. male  was evaluated in triage.  Pt complains of chest pain into his middle of the chest that radiates into his left arm.  These episodes last about 5 seconds.  He has them go into his left jaw also.  None have lasted more than 5 seconds no persistent shortness of breath.  He has taken 7 baby asa today.    He reports that he smokes, and hasn't been to a PCP in decades.   Review of Systems  Positive: Chest pain Negative: Syncope, shortness of breath  Physical Exam  BP 135/84   Pulse 79   Temp 98.3 F (36.8 C) (Oral)   Resp 18   Ht 5\' 10"  (1.778 m)   Wt 108.9 kg   SpO2 97%   BMI 34.44 kg/m  Gen:   Awake, no distress   Resp:  Normal effort  MSK:   Moves extremities without difficulty  Other:  Normal speech.   Medical Decision Making  Medically screening exam initiated at 2:00 PM.  Appropriate orders placed.  KYIAN OBST was informed that the remainder of the evaluation will be completed by another provider, this initial triage assessment does not replace that evaluation, and the importance of remaining in the ED until their evaluation is complete.  Note: Portions of this report may have been transcribed using voice recognition software. Every effort was made to ensure accuracy; however, inadvertent computerized transcription errors may be present    Stormy Card, PA-C 07/30/21 1404    09/29/21, MD 07/30/21 [phone]

## 2021-07-30 NOTE — Discharge Instructions (Addendum)
Make sure to take aspirin daily.  Return to the ED as we discussed to complete your cardiac evaluation.  I recommend [facility].  Try to quit smoking.  REturn immediately for recurrent chest pain.

## 2021-07-30 NOTE — Progress Notes (Signed)
CSW met with Bruce Wells to discuss lack of PCP.  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  Did not think he could get PCP due to no insurance, discussed [facility] and [facility] as two PCP offices that can see him without insurance.  Discussed calling to make appt to get established, there will be a wait.  Bruce Wells verbalized understanding and indicated that he does want PCP as "I'm 55 now." Lurline Idol, MSW, LCSW 10/12/20226:20 PM

## 2022-04-10 IMAGING — CR DG CHEST 2V
2 series · 2 of 2 positions shown · non-contrast
Comparison: Chest radiograph 01/15/2013

CLINICAL DATA: Chest pain

EXAM:
CHEST - 2 VIEW

[w chest pa]
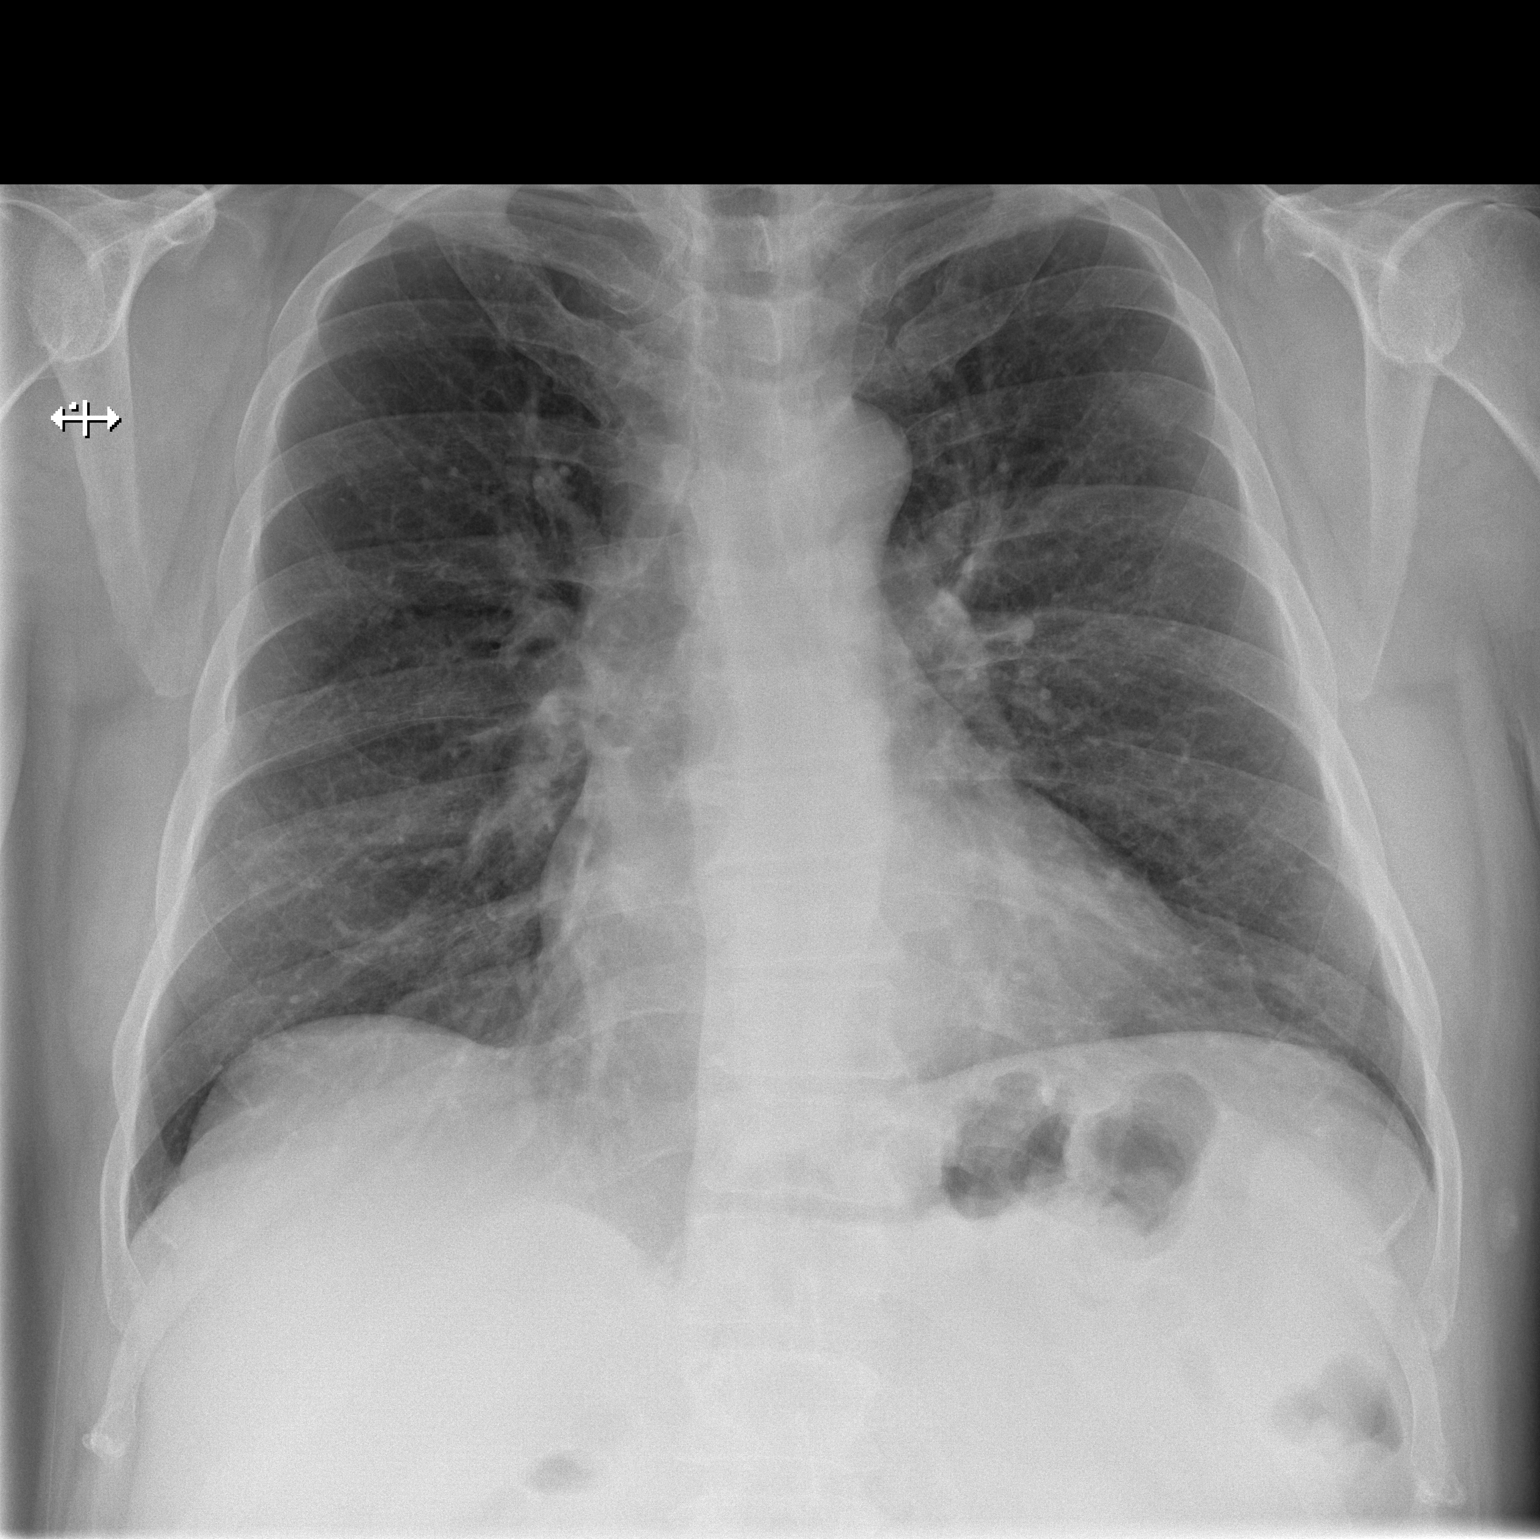

[w chest lat]
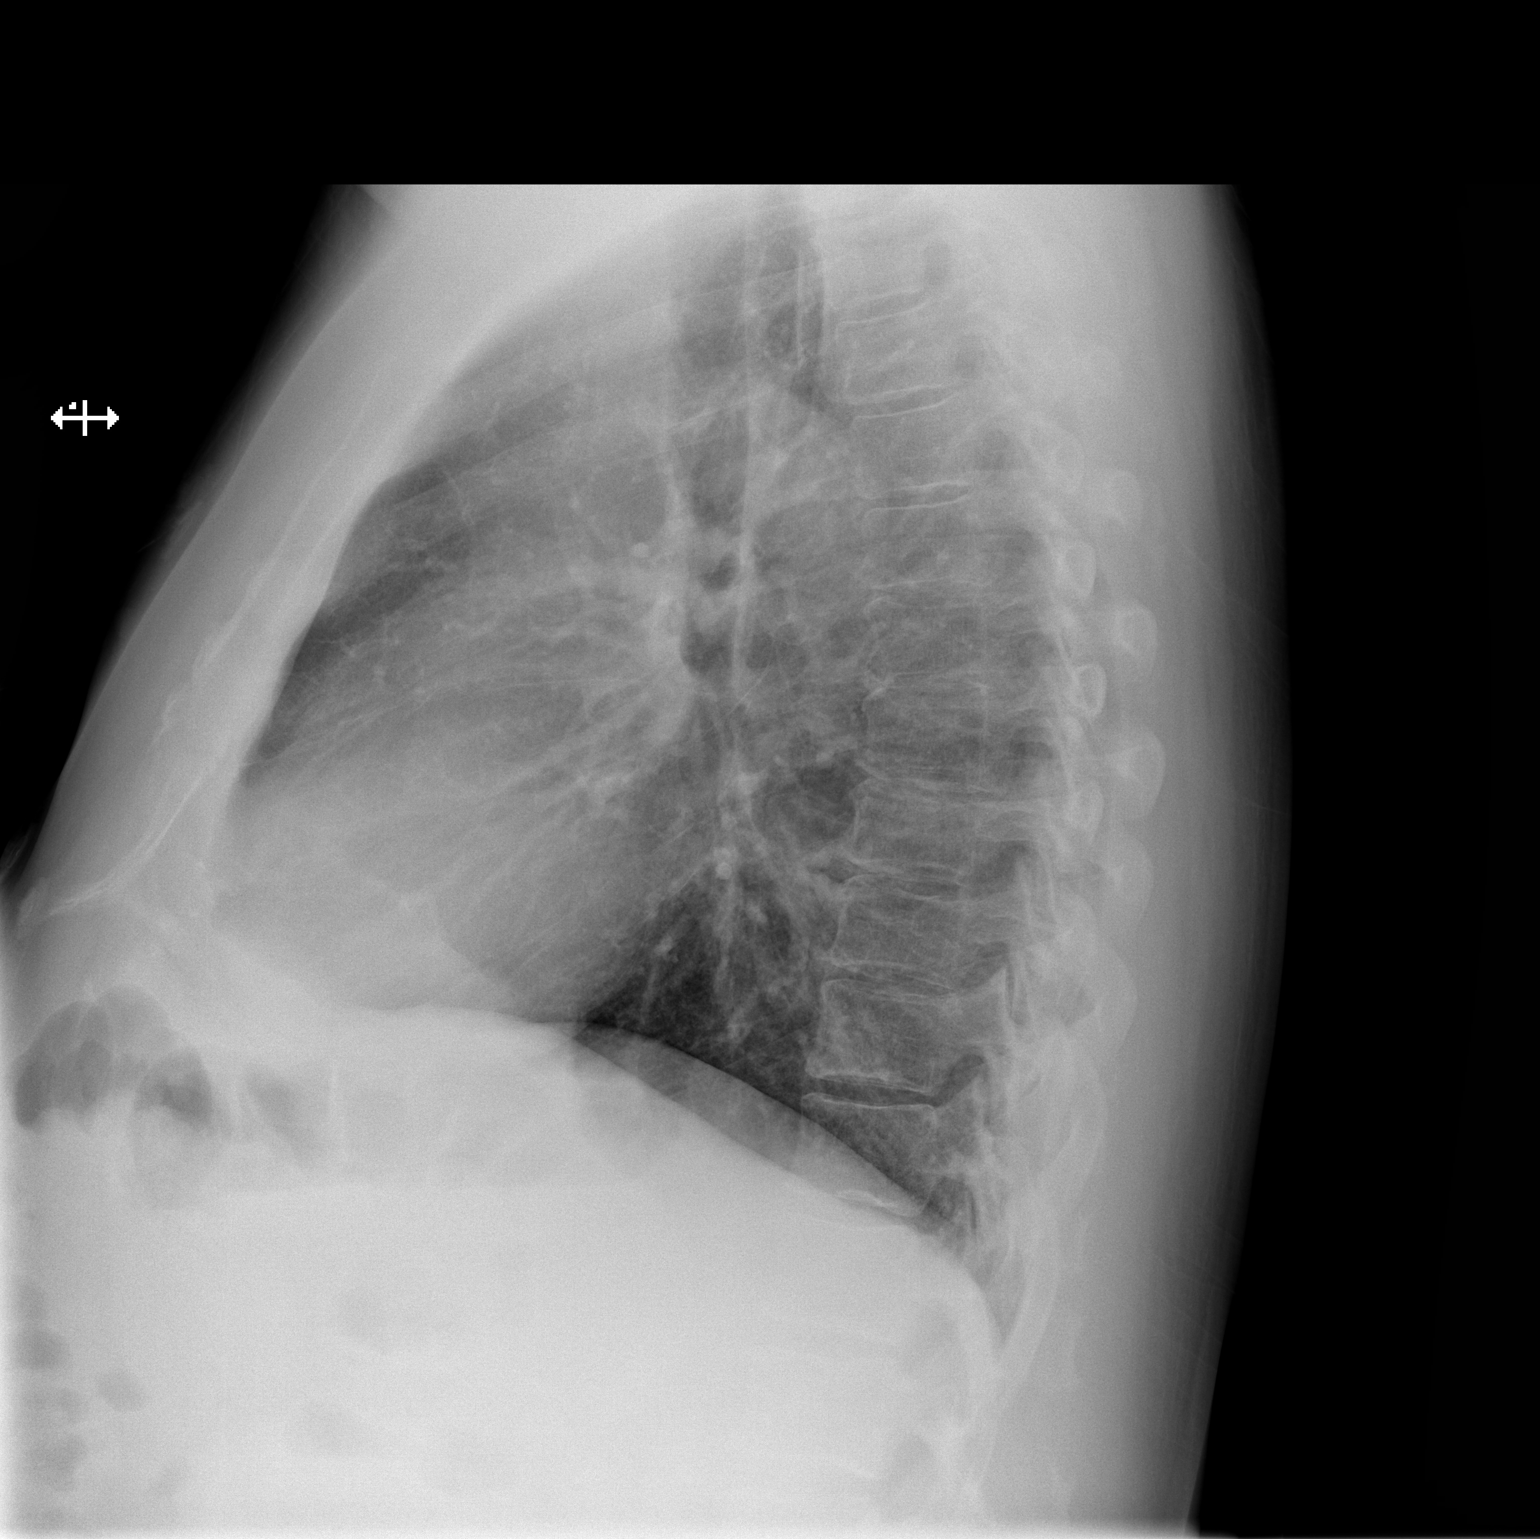

[2 of 2 positions shown; findings below may reference images not displayed]

FINDINGS: The cardiomediastinal silhouette is within normal limits.

There is no focal consolidation or pulmonary edema. There is no
pleural effusion or pneumothorax.

There is no acute osseous abnormality.
IMPRESSION: No radiographic evidence of acute cardiopulmonary process.

## 2024-05-29 ENCOUNTER — Encounter (HOSPITAL_COMMUNITY): Payer: Self-pay | Admitting: Family Medicine

## 2024-05-29 ENCOUNTER — Encounter (HOSPITAL_COMMUNITY): Payer: Self-pay

## 2024-05-29 ENCOUNTER — Emergency Department (HOSPITAL_COMMUNITY)
Admission: EM | Admit: 2024-05-29 | Discharge: 2024-05-29 | Disposition: A | Discharge status: Other Acute Inpatient Hospital | Payer: Self-pay | Attending: Emergency Medicine | Admitting: Emergency Medicine

## 2024-05-29 ENCOUNTER — Other Ambulatory Visit: Payer: Self-pay

## 2024-05-29 ENCOUNTER — Emergency Department (HOSPITAL_COMMUNITY): Payer: Self-pay

## 2024-05-29 ENCOUNTER — Inpatient Hospital Stay (HOSPITAL_COMMUNITY)
Admission: AD | Admit: 2024-05-29 | Discharge: 2024-06-02 | DRG: 885 | Disposition: A | Discharge status: Home / Self Care | Source: Intra-hospital | Attending: Psychiatry | Admitting: Psychiatry

## 2024-05-29 DIAGNOSIS — Z823 Family history of stroke: Secondary | ICD-10-CM

## 2024-05-29 DIAGNOSIS — Z5986 Financial insecurity: Secondary | ICD-10-CM

## 2024-05-29 DIAGNOSIS — F419 Anxiety disorder, unspecified: Secondary | ICD-10-CM | POA: Diagnosis present

## 2024-05-29 DIAGNOSIS — Z8249 Family history of ischemic heart disease and other diseases of the circulatory system: Secondary | ICD-10-CM | POA: Diagnosis not present

## 2024-05-29 DIAGNOSIS — F329 Major depressive disorder, single episode, unspecified: Secondary | ICD-10-CM | POA: Insufficient documentation

## 2024-05-29 DIAGNOSIS — F411 Generalized anxiety disorder: Secondary | ICD-10-CM | POA: Insufficient documentation

## 2024-05-29 DIAGNOSIS — Z716 Tobacco abuse counseling: Secondary | ICD-10-CM | POA: Diagnosis not present

## 2024-05-29 DIAGNOSIS — Z5941 Food insecurity: Secondary | ICD-10-CM

## 2024-05-29 DIAGNOSIS — F322 Major depressive disorder, single episode, severe without psychotic features: Principal | ICD-10-CM | POA: Diagnosis present

## 2024-05-29 DIAGNOSIS — Z599 Problem related to housing and economic circumstances, unspecified: Secondary | ICD-10-CM

## 2024-05-29 DIAGNOSIS — F17210 Nicotine dependence, cigarettes, uncomplicated: Secondary | ICD-10-CM | POA: Diagnosis present

## 2024-05-29 DIAGNOSIS — Z800 Family history of malignant neoplasm of digestive organs: Secondary | ICD-10-CM | POA: Diagnosis not present

## 2024-05-29 DIAGNOSIS — Z833 Family history of diabetes mellitus: Secondary | ICD-10-CM | POA: Diagnosis not present

## 2024-05-29 DIAGNOSIS — F17290 Nicotine dependence, other tobacco product, uncomplicated: Secondary | ICD-10-CM | POA: Diagnosis present

## 2024-05-29 DIAGNOSIS — Z818 Family history of other mental and behavioral disorders: Secondary | ICD-10-CM | POA: Diagnosis not present

## 2024-05-29 DIAGNOSIS — Z87442 Personal history of urinary calculi: Secondary | ICD-10-CM

## 2024-05-29 DIAGNOSIS — Z5901 Sheltered homelessness: Secondary | ICD-10-CM

## 2024-05-29 DIAGNOSIS — R7309 Other abnormal glucose: Secondary | ICD-10-CM | POA: Insufficient documentation

## 2024-05-29 DIAGNOSIS — R45851 Suicidal ideations: Secondary | ICD-10-CM | POA: Diagnosis present

## 2024-05-29 DIAGNOSIS — F1290 Cannabis use, unspecified, uncomplicated: Secondary | ICD-10-CM | POA: Diagnosis present

## 2024-05-29 DIAGNOSIS — F332 Major depressive disorder, recurrent severe without psychotic features: Secondary | ICD-10-CM | POA: Diagnosis not present

## 2024-05-29 DIAGNOSIS — Z5982 Transportation insecurity: Secondary | ICD-10-CM | POA: Diagnosis not present

## 2024-05-29 DIAGNOSIS — G4700 Insomnia, unspecified: Secondary | ICD-10-CM | POA: Diagnosis not present

## 2024-05-29 LAB — CBC WITH DIFFERENTIAL/PLATELET
Abs Immature Granulocytes: 0.04 K/uL (ref 0.00–0.07)
Basophils Absolute: 0 K/uL (ref 0.0–0.1)
Basophils Relative: 0 %
Eosinophils Absolute: 0.1 K/uL (ref 0.0–0.5)
Eosinophils Relative: 1 %
HCT: 45.1 % (ref 39.0–52.0)
Hemoglobin: 14.7 g/dL (ref 13.0–17.0)
Immature Granulocytes: 1 %
Lymphocytes Relative: 15 %
Lymphs Abs: 1.2 K/uL (ref 0.7–4.0)
MCH: 30.6 pg (ref 26.0–34.0)
MCHC: 32.6 g/dL (ref 30.0–36.0)
MCV: 94 fL (ref 80.0–100.0)
Monocytes Absolute: 0.6 K/uL (ref 0.1–1.0)
Monocytes Relative: 7 %
Neutro Abs: 6.2 K/uL (ref 1.7–7.7)
Neutrophils Relative %: 76 %
Platelets: 215 K/uL (ref 150–400)
RBC: 4.8 MIL/uL (ref 4.22–5.81)
RDW: 12.6 % (ref 11.5–15.5)
WBC: 8.1 K/uL (ref 4.0–10.5)
nRBC: 0 % (ref 0.0–0.2)

## 2024-05-29 LAB — RAPID URINE DRUG SCREEN, HOSP PERFORMED
Amphetamines: NOT DETECTED
Barbiturates: NOT DETECTED
Benzodiazepines: NOT DETECTED
Cocaine: NOT DETECTED
Opiates: NOT DETECTED
Tetrahydrocannabinol: POSITIVE — AB

## 2024-05-29 LAB — CBG MONITORING, ED: Glucose-Capillary: 94 mg/dL (ref 70–99)

## 2024-05-29 LAB — COMPREHENSIVE METABOLIC PANEL WITH GFR
ALT: 44 U/L (ref 0–44)
AST: 89 U/L — ABNORMAL HIGH (ref 15–41)
Albumin: 3.3 g/dL — ABNORMAL LOW (ref 3.5–5.0)
Alkaline Phosphatase: 34 U/L — ABNORMAL LOW (ref 38–126)
Anion gap: 13 (ref 5–15)
BUN: 11 mg/dL (ref 6–20)
CO2: 19 mmol/L — ABNORMAL LOW (ref 22–32)
Calcium: 8.2 mg/dL — ABNORMAL LOW (ref 8.9–10.3)
Chloride: 103 mmol/L (ref 98–111)
Creatinine, Ser: 0.65 mg/dL (ref 0.61–1.24)
GFR, Estimated: >60 mL/min (ref >60)
Glucose, Bld: 91 mg/dL (ref 70–99)
Potassium: 3.9 mmol/L (ref 3.5–5.1)
Sodium: 135 mmol/L (ref 135–145)
Total Bilirubin: 1.3 mg/dL — ABNORMAL HIGH (ref 0.0–1.2)
Total Protein: 5.9 g/dL — ABNORMAL LOW (ref 6.5–8.1)

## 2024-05-29 LAB — SALICYLATE LEVEL: Salicylate Lvl: <7 mg/dL — ABNORMAL LOW (ref 7.0–30.0)

## 2024-05-29 LAB — ETHANOL: Alcohol, Ethyl (B): <15 mg/dL (ref <15)

## 2024-05-29 LAB — ACETAMINOPHEN LEVEL: Acetaminophen (Tylenol), Serum: <10 ug/mL — ABNORMAL LOW (ref 10–30)

## 2024-05-29 MED ORDER — LORAZEPAM 2 MG/ML IJ SOLN: 2.0000 mg | Freq: Three times a day (TID) | INTRAMUSCULAR | Status: DC | PRN

## 2024-05-29 MED ORDER — HYDROXYZINE HCL 25 MG PO TABS
25.0000 mg | ORAL_TABLET | Freq: Three times a day (TID) | ORAL | Status: DC | PRN
  Filled 2024-05-29: qty 10

## 2024-05-29 MED ORDER — HALOPERIDOL LACTATE 5 MG/ML IJ SOLN: 5.0000 mg | Freq: Three times a day (TID) | INTRAMUSCULAR | Status: DC | PRN

## 2024-05-29 MED ORDER — NICOTINE 7 MG/24HR TD PT24
7.0000 mg | MEDICATED_PATCH | Freq: Once | TRANSDERMAL | Status: DC
  Administered 2024-05-29 (×2): 7 mg via TRANSDERMAL
  Filled 2024-05-29: qty 1

## 2024-05-29 MED ORDER — DIPHENHYDRAMINE HCL 50 MG/ML IJ SOLN: 50.0000 mg | Freq: Three times a day (TID) | INTRAMUSCULAR | Status: DC | PRN

## 2024-05-29 MED ORDER — DIPHENHYDRAMINE HCL 25 MG PO CAPS
50.0000 mg | ORAL_CAPSULE | Freq: Three times a day (TID) | ORAL | Status: DC | PRN
Start: 2024-05-29 — End: 2024-06-02

## 2024-05-29 MED ORDER — ACETAMINOPHEN 325 MG PO TABS
650.0000 mg | ORAL_TABLET | Freq: Four times a day (QID) | ORAL | Status: DC | PRN
  Administered 2024-05-30 (×2): 650 mg via ORAL
  Filled 2024-05-29: qty 2

## 2024-05-29 MED ORDER — HALOPERIDOL 5 MG PO TABS: 5.0000 mg | ORAL_TABLET | Freq: Three times a day (TID) | ORAL | Status: DC | PRN

## 2024-05-29 MED ORDER — MAGNESIUM HYDROXIDE 400 MG/5ML PO SUSP: 30.0000 mL | Freq: Every day | ORAL | Status: DC | PRN

## 2024-05-29 MED ORDER — HALOPERIDOL LACTATE 5 MG/ML IJ SOLN: 10.0000 mg | Freq: Three times a day (TID) | INTRAMUSCULAR | Status: DC | PRN

## 2024-05-29 MED ORDER — ALUM & MAG HYDROXIDE-SIMETH 200-200-20 MG/5ML PO SUSP: 30.0000 mL | ORAL | Status: DC | PRN

## 2024-05-29 MED ORDER — NICOTINE 21 MG/24HR TD PT24
21.0000 mg | MEDICATED_PATCH | Freq: Every day | TRANSDERMAL | Status: DC
  Administered 2024-05-30 – 2024-06-02 (×6): 21 mg via TRANSDERMAL
  Filled 2024-05-29 (×3): qty 1

## 2024-05-29 MED ORDER — TRAZODONE HCL 50 MG PO TABS
50.0000 mg | ORAL_TABLET | Freq: Every evening | ORAL | Status: DC | PRN
  Administered 2024-05-30 (×2): 50 mg via ORAL
  Filled 2024-05-29: qty 1

## 2024-05-29 NOTE — ED Notes (Addendum)
 Patient dressed out in burgundy scrubs and yellow socks, belongings including a wallet, phone, lighter, cigarettes, charger, sweatshirt, tshirt, shorts, long pants, two pairs of socks, and a pair of shoes labeled and placed in 16-18 belongings cabinet.

## 2024-05-29 NOTE — Plan of Care (Signed)
   Problem: Education: Goal: Knowledge of [facility] Education information/materials will improve Outcome: Progressing Goal: Emotional status will improve Outcome: Progressing Goal: Verbalization of understanding the information provided will improve Outcome: Progressing

## 2024-05-29 NOTE — Progress Notes (Signed)
 Pt has been accepted to [facility] on 05/29/2024  Bed assignment:306-1.   Pt meets inpatient criteria per Cathaleen Adam, NP  Attending Physician will be Dr. Leita Arts  Report can be called to: - Adult unit: [phone]  Care Team Notified: [facility] Cherylynn Ernst, RN, Vermell Pepper, RN, Cathaleen Adam, NP

## 2024-05-29 NOTE — ED Notes (Signed)
 [facility] called pts [employer] [facility]) to inquire about how long pt is paid up for. Per the [employer] from desk pt is in good standing until August 19th. The motel staff also offered to take care of pts cats while he is in the hospital. [facility] informed pt of his standing at the motel and that his cats will be looked after while he is hospitalized. Pt was appreciative of the information.   Chesley Holt, [facility]

## 2024-05-29 NOTE — Tx Team (Signed)
 Initial Treatment Plan 05/29/2024 8:00 PM Bruce Wells FMW:[id number]    PATIENT STRESSORS: Financial difficulties   Occupational concerns     PATIENT STRENGTHS: Active sense of humor  Communication skills  Work skills    PATIENT IDENTIFIED PROBLEMS:                      DISCHARGE CRITERIA:  Ability to meet basic life and health needs  PRELIMINARY DISCHARGE PLAN: Placement in alternative living arrangements  PATIENT/FAMILY INVOLVEMENT: This treatment plan has been presented to and reviewed with the patient, Bruce Wells.  The patient and family have been given the opportunity to ask questions and make suggestions.  Elouise Wolm Morel, RN 05/29/2024, 8:00 PM

## 2024-05-29 NOTE — ED Notes (Signed)
 Pt. CBG 94, RN made aware.

## 2024-05-29 NOTE — ED Triage Notes (Signed)
 Patient arrived with EMS after a plan to hang himself. States he took 100 Benadryl , but told EMS that was a few days ago.

## 2024-05-29 NOTE — [facility] Group Notes (Signed)
 Psychoeducational Group Note  Date:  05/29/2024 Time:  2000  Group Topic/Focus:  Alcoholics Anonymous Meeting  Participation Level: Did Not Attend  Participation Quality:  Not Applicable  Affect:  Not Applicable  Cognitive:  Not Applicable  Insight:  Not Applicable  Engagement in Group: Not Applicable  Additional Comments:  Did not attend.   Lenora Manuelita RAMAN 05/29/2024, 9:39 PM

## 2024-05-29 NOTE — ED Notes (Signed)
 [facility] here for transport, paperwork and pt belongings given to driver.

## 2024-05-29 NOTE — ED Provider Notes (Signed)
 [facility]-EMERGENCY DEPT [facility] Emergency Department Provider Note MRN:  [id number]  Arrival date & time: 05/29/24     Chief Complaint   Suicide Attempt   History of Present Illness   Bruce Wells is a 58 y.o. year-old male presents to the ED with chief complaint of suicidal thoughts.  States that he took around 100 sleeping pills a day or 2 ago.  He is not exactly sure when.  He does not think that it was today though.  He states that he did drink a 40 today.  He denies any drug use.  Denies any recent illness.  States that he has no desire to live, but cannot even kill myself.  History provided by patient.   Review of Systems  Pertinent positive and negative review of systems noted in HPI.    Physical Exam   Vitals:   05/29/24 0511  BP: 124/73  Pulse: 80  Resp: 18  Temp: 98.2 F (36.8 C)  SpO2: 99%    CONSTITUTIONAL:  intoxicated-appearing, NAD NEURO:  Alert and oriented x 3, CN 3-12 grossly intact EYES:  eyes equal and reactive ENT/NECK:  Supple, no stridor  CARDIO:  normal rate, regular rhythm, appears well-perfused  PULM:  No respiratory distress, CTAB GI/GU:  non-distended,  MSK/SPINE:  No gross deformities, no edema, moves all extremities  SKIN:  no rash, atraumatic   *Additional and/or pertinent findings included in MDM below  Diagnostic and Interventional Summary    EKG Interpretation Date/Time:    Ventricular Rate:    PR Interval:    QRS Duration:    QT Interval:    QTC Calculation:   R Axis:      Text Interpretation:         Labs Reviewed  COMPREHENSIVE METABOLIC PANEL WITH GFR - Abnormal; Notable for the following components:      Result Value   CO2 19 (*)    Calcium 8.2 (*)    Total Protein 5.9 (*)    Albumin 3.3 (*)    AST 89 (*)    Alkaline Phosphatase 34 (*)    Total Bilirubin 1.3 (*)    All other components within normal limits  SALICYLATE LEVEL - Abnormal; Notable for the following components:   Salicylate Lvl <7.0  (*)    All other components within normal limits  ACETAMINOPHEN  LEVEL - Abnormal; Notable for the following components:   Acetaminophen  (Tylenol ), Serum <10 (*)    All other components within normal limits  ETHANOL  CBC WITH DIFFERENTIAL/PLATELET  RAPID URINE DRUG SCREEN, HOSP PERFORMED  CBG MONITORING, ED    No orders to display    Medications - No data to display   Procedures  /  Critical Care Procedures  ED Course and Medical Decision Making  I have reviewed the triage vital signs, the nursing notes, and pertinent available records from the EMR.  Social Determinants Affecting Complexity of Care: Patient has no clinically significant social determinants affecting this chief complaint..   ED Course:    Medical Decision Making Patient here with suicidal thoughts.  States that he has no desire to live.  States that he cannot even kill myself.  He reports drinking alcohol tonight.  Denies any other drug use.  States that he took a bunch of sleeping pills a few days ago.  He denies any recent illnesses.  He appears medically clear for TTS/psychiatry evaluation.  Amount and/or Complexity of Data Reviewed Labs: ordered.  Consultants: TTS consult pending   Treatment and Plan: Patient signed out to oncoming team.    Final Clinical Impressions(s) / ED Diagnoses     ICD-10-CM   1. Suicidal thoughts  R45.851       ED Discharge Orders     None         Discharge Instructions Discussed with and Provided to Patient:   Discharge Instructions   None      Vicky Charleston, PA-C 05/29/24 0606    Jerral Meth, MD 05/29/24 [phone]

## 2024-05-29 NOTE — Progress Notes (Signed)
 Patient admitted for a failed suicide attempt from hanging himself with a belt at his hotel room. He reports that was his first time and he was upset it did not work. At the time of admission he denies SI, HI, and AVH. He denies ETOH and illegal substances. He is currently living at a hotel which was reported to be paid up until August 19th. He reports depression at 8 due to his two cats being at the hotel in the room. He reports left hip pain due to falling several times during suicide attempt. Patient has a yellow bruise on his left shoulder, no bruise on the left hip, and abrasion on left knee. He reports not having any family or support system.

## 2024-05-29 NOTE — Consult Note (Signed)
 [facility] Psychiatric Consult Initial  Patient Name: .Bruce Wells  MRN: [id number]  DOB: Sep 24, 1966  Consult Order details:  Orders (From admission, onward)     Start     Ordered   05/29/24 0605  CONSULT TO CALL ACT TEAM       Ordering Provider: Vicky Charleston, PA-C  Provider:  (Not yet assigned)  Question:  Reason for Consult?  Answer:  Psych consult   05/29/24 0605             Mode of Visit: In person    Psychiatry Consult Evaluation  Service Date: May 29, 2024 LOS:  LOS: 0 days  Chief Complaint plan to hang self  Primary Psychiatric Diagnoses  Major depressive disorder 2.   Anxiety  Assessment  Bruce Wells is a 58 y.o. male admitted: Presented to the ED on 05/29/2024  4:58 AM for a plan to hang himself, also took 100 Benadryl . He carries the psychiatric diagnoses of depression and anxiety and has a past medical history of kidney stones.   His current presentation of sadness, lack of energy, lack of appetite and sleep, dysphoria and hopelessness is most consistent with decompensating mental health. He meets criteria for inpatient psychiatric admission based on current presentation.  Current outpatient psychotropic medications include none. On initial examination, patient is tearful, dysphoric mood. Please see plan below for detailed recommendations.   Diagnoses:  Active Hospital problems: Principal Problem:   MDD (major depressive disorder), recurrent severe, without psychosis (HCC) Active Problems:   Anxiety state    Plan   ## Psychiatric Medication Recommendations:  Atarax  25 mg p.o. 3 times daily as needed for anxiety Trazodone  50 mg p.o. at night as needed for insomnia  ## Medical Decision Making Capacity: Not specifically addressed in this encounter  ## Further Work-up:  -- No further workup needed at this time EKG or UDS -- most recent EKG on 05/29/2024 had QtC of 429 -- Pertinent labwork reviewed earlier this admission includes: CBC,  CMP, EKG, UDS   ## Disposition:-- We recommend inpatient psychiatric hospitalization. Patient is under voluntary admission status at this time; please IVC if attempts to leave hospital.  ## Behavioral / Environmental: -To minimize splitting of staff, assign one staff person to communicate all information from the team when feasible. or Utilize compassion and acknowledge the patient's experiences while setting clear and realistic expectations for care.    ## Safety and Observation Level:  - Based on my clinical evaluation, I estimate the patient to be at moderate risk of self harm in the current setting. - At this time, we recommend  1:1 Observation. This decision is based on my review of the chart including patient's history and current presentation, interview of the patient, mental status examination, and consideration of suicide risk including evaluating suicidal ideation, plan, intent, suicidal or self-harm behaviors, risk factors, and protective factors. This judgment is based on our ability to directly address suicide risk, implement suicide prevention strategies, and develop a safety plan while the patient is in the clinical setting. Please contact our team if there is a concern that risk level has changed.  CSSR Risk Category:C-SSRS RISK CATEGORY: High Risk  Suicide Risk Assessment: Patient has following modifiable risk factors for suicide: active suicidal ideation, untreated depression, recklessness, and current symptoms: anxiety/panic, insomnia, impulsivity, anhedonia, hopelessness, which we are addressing by recommending inpatient psychiatric admission. Patient has following non-modifiable or demographic risk factors for suicide: male gender and psychiatric hospitalization Patient has the following protective  factors against suicide: Supportive friends, patient has pets in the home.  Thank you for this consult request. Recommendations have been communicated to the primary team.  We will  continue to follow patient at this time.   Kairos Panetta MOTLEY-MANGRUM, PMHNP       History of Present Illness  Relevant Aspects of Hospital ED Course:  Admitted on 05/29/2024 for a plan to hang himself, also took 100 Benadryl .    Patient Report:  Bruce Wells, 58 y.o., male patient seen face to face by this provider, consulted with Dr. Larina; and chart reviewed on 05/29/24.  On evaluation Bruce Wells reports that has he been currently staying in a hotel, and he has been having a lot of financial difficulty.  He states that he felt it would be better that he was not here on earth, and he attempted to hang himself, but he could not get his belt to stay in the door, so it was not successful, states he also took 100 Benadryl  last week with a 40 ounce of beer, states it only made him drowsy.  Patient states he also has been thinking of electrocuting himself, to get the job done.  States that he has significant fear of becoming homeless again, states that he works so hard to not be homeless, he is currently living in a motel with his 2 cats.  States that he was working at [employer] full-time, and also at the [employer], but [employer] was not increasing his pay, but asked him to do more work, he states he quit there, because he was offered a full time job at [employer] but states it fell through and [employer] could not give him the job. 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  Patient becomes very tearful during the assessment, stating he is financially stressed.  He is unsure if he is ever been admitted to an inpatient psychiatric facility, stating he probably has due to his mother, states she has a variety of mental health issues. Patient endorses using thc, but  denies using any other illicit substances. Patient states he would rather be dead, then be under a lot of financial stress.   During evaluation Bruce Wells is in no acute distress. He is alert, oriented x 4, calm, cooperative and attentive. His mood is dysphoric with congruent affect.  He has normal speech, and behavior.  Objectively there is no evidence of psychosis/mania or delusional thinking.  Patient is able to converse coherently, goal directed thoughts, no distractibility, or pre-occupation. He currently denies self-harm/homicidal ideation, psychosis, and paranoia.  Patient continues to endorse suicidal ideations.    Psych ROS:  Depression: Endorses Anxiety:  Endorses Mania (lifetime and current): Denies  Psychosis: (lifetime and current): Denies   Collateral information:  None, per patient   Review of Systems  Psychiatric/Behavioral:  Positive for depression and suicidal ideas.      Psychiatric and Social History  Psychiatric History:  Information collected from patient  Prev Dx/Sx: Depression and anxiety Current Psych Provider: Denies Home Meds (current): Denies Previous Med Trials: Denies Therapy: Denies  Prior Psych Hospitalization: Unsure Prior Self Harm: Denies Prior Violence: Denies  Family Psych History: Yes Family Hx suicide: Denies  Social History:  Developmental Hx: Deferred Educational Hx: Graduated high school  Occupational Hx: Works part-time at the [employer] Hx: Denies Living Situation: Lives alone in a hotel Spiritual Hx: Unknown Access to weapons/lethal means: Denies   Substance History Alcohol: Past history Last Drink 10 or more years ago Tobacco: Yes Illicit drugs: Past history 10 or more years ago Prescription drug abuse: Denies Rehab hx: Denies  Exam Findings  Physical Exam:  Vital Signs:  Temp:  [98.2 F (36.8 C)-98.5 F (36.9 C)] 98.5 F (36.9 C) (08/11 1444) Pulse Rate:  [74-80] 80 (08/11 1444) Resp:   [18-20] 20 (08/11 1444) BP: (103-134)/(73-77) 134/76 (08/11 1444) SpO2:  [96 %-99 %] 96 % (08/11 1444) Blood pressure 134/76, pulse 80, temperature 98.5 F (36.9 C), temperature source Oral, resp. rate 20, SpO2 96%. There is no height or weight on file to calculate BMI.  Physical Exam Vitals and nursing note reviewed. Exam conducted with a chaperone present.  Neurological:     Mental Status: He is alert.  Psychiatric:        Attention and Perception: Attention normal.        Mood and Affect: Mood is depressed. Affect is flat and tearful.        Speech: Speech normal.        Behavior: Behavior is cooperative.        Thought Content: Thought content includes suicidal ideation. Thought content includes suicidal plan.        Cognition and Memory: Memory normal.        Judgment: Judgment is inappropriate.     Mental Status Exam: General Appearance: Casual  Orientation:  Full (Time, Place, and Person)  Memory:  Immediate;   Fair Remote;   Fair  Concentration:  Concentration: Fair and Attention Span: Fair  Recall:  Fair  Attention  Fair  Eye Contact:  Good  Speech:  Clear and Coherent  Language:  Good  Volume:  Normal  Mood: dysphoric   Affect:  Congruent  Thought Process:  Coherent  Thought Content:  WDL  Suicidal Thoughts:  Yes.  with intent/plan  Homicidal Thoughts:  No  Judgement:  Impaired  Insight:  Lacking  Psychomotor Activity:  Normal  Akathisia:  NA  Fund of Knowledge:  Fair      Assets:  Manufacturing systems engineer Desire for Improvement  Cognition:  WNL  ADL's:  Intact  AIMS (if indicated):        Other History   These have been pulled in through the EMR, reviewed, and updated if appropriate.  Family History:  The patient's family history is not on file.  Medical History: Past Medical History:  Diagnosis Date   Seizures (HCC)    Viral meningitis     Surgical History: History reviewed. No pertinent surgical history.   Medications:   Current  Facility-Administered Medications:    nicotine  (NICODERM CQ  - dosed in mg/24 hr) patch 7 mg, 7 mg, Transdermal, Once, Young, Travis J, DO, 7 mg at 05/29/24 1153  Current Outpatient Medications:    acetaminophen  (TYLENOL ) 500 MG tablet, Take 1,000 mg by mouth every 6 (six) hours as needed for pain., Disp: , Rfl:   Allergies: Allergies  Allergen Reactions   Sulfa Antibiotics Anaphylaxis    Donnalynn Wheeless MOTLEY-MANGRUM, PMHNP

## 2024-05-30 ENCOUNTER — Encounter (HOSPITAL_COMMUNITY): Payer: Self-pay | Admitting: Family Medicine

## 2024-05-30 LAB — TSH: TSH: 1.171 u[IU]/mL (ref 0.350–4.500)

## 2024-05-30 LAB — HIV ANTIBODY (ROUTINE TESTING W REFLEX): HIV Screen 4th Generation wRfx: NONREACTIVE

## 2024-05-30 LAB — VITAMIN D 25 HYDROXY (VIT D DEFICIENCY, FRACTURES): Vit D, 25-Hydroxy: 11.54 ng/mL — ABNORMAL LOW (ref 30–100)

## 2024-05-30 LAB — VITAMIN B12: Vitamin B-12: 159 pg/mL — ABNORMAL LOW (ref 180–914)

## 2024-05-30 MED ORDER — BUPROPION HCL ER (XL) 150 MG PO TB24
150.0000 mg | ORAL_TABLET | Freq: Every day | ORAL | Status: DC
  Administered 2024-05-30 – 2024-06-02 (×6): 150 mg via ORAL
  Filled 2024-05-30: qty 1
  Filled 2024-05-30: qty 7
  Filled 2024-05-30 (×3): qty 1

## 2024-05-30 NOTE — Group Note (Signed)
 LCSW Group Therapy Note   Group Date: 05/30/2024 Start Time: 1100 End Time: 1200   Participation:  patient was present  Type of Therapy:  Group Therapy   Topic:  Lifestyle:  from "One Day" to "Today is Day One"  Objective:  To promote mental and physical well-being through lifestyle changes in routine, nutrition, sleep, and movement.  Goals: Increase awareness of how lifestyle habits impact mental health. Encourage one small, achievable wellness goal. Support group sharing and accountability.  Summary:  Group members explored how daily habits influence mental health and discussed the importance of starting with small, manageable changes. Participants identified personal goals and shared reflections on improving structure, sleep, diet, and physical activity.  Therapeutic Modalities: CBT - Identifying and challenging all-or-nothing thinking; promoting realistic, helpful thoughts about change. Psychoeducation - Teaching about the impact of sleep, nutrition, movement, and routine on mental health. Motivational Interviewing - Eliciting personal motivation and exploring readiness for change. Goal-Setting - Supporting SMART goals to build self-efficacy and encourage follow-through.   Ka Flammer O Tyden Kann, LCSWA 05/30/2024  7:01 PM

## 2024-05-30 NOTE — H&P (Addendum)
 Psychiatric Admission Assessment Adult  Patient Identification: Bruce Wells MRN:  [id number] Date of Evaluation:  05/30/2024 Chief Complaint:  MDD (major depressive disorder), single episode, severe , no psychosis (HCC) [F32.2] Principal Diagnosis: MDD (major depressive disorder), single episode, severe , no psychosis (HCC) Diagnosis:  Principal Problem:   MDD (major depressive disorder), single episode, severe , no psychosis (HCC)  CC:  I took several pills to go to sleep and never had to wake up. But I was not able to sleep, so I decided to hang myself with a belt.  However, this did not work. I thought about electrocuting myself, but decided to call EMS for help.  History of Present Illness: This is the first admission to this [facility] for this unfortunate 58 year old Caucasian male with prior psychiatric diagnoses significant for MDD recurrent severe, and multiple bereavements.  Patient presents voluntarily to [facility] from [place], ED for worsening depression resulting in overdose with 100 pills of Benadryl , attempt to hang himself/electrocute himself in the context of pervasive depression.  BAL: Less than 15.  UDS: Positive for marijuana.  During this admission assessment, patient reports that he lives in the motel with his 2 cats.  He presented that he has attempted to do the right things in life, however, his situation never improved.  Reports that during COVID-19 pandemic, he lost his father, mother, brothers, and other several family members due to the pandemic and he did not effectively morn for them.  Reports for the past 1 week, he became overwhelmingly depressed and reflected on why he did not die during the pandemic.  He denies being followed by a therapist or psychiatrist.  Denies drug use, however, endorsed heavy use of cocaine, heroin, acid, and painkillers when he was young but stopped drug use by himself without rehabilitation in 1990.  Also reports  stopping alcohol drinking in 1990.  Endorses selling plasma for a living.  Reports smoking 1 pack of cigarette daily.  Reports he never taken psychotropic drugs before.  Patient is single, no children and lives in the [place].  Patient reports he is open to antidepressant, however, not keen on antidepressant that will have sexual side effects.  Reports symptoms depression to include hopelessness, worthlessness, sadness, anhedonia, poor sleep, and suicidal thoughts.  He reports anxiety symptoms to include panicky, shortness of breath,  worrying too much, being fidgety.  He denies psychotic symptoms, PTSD symptoms, mania or OCD.  Objective: Patient presents alert, with labile mood, and oriented to person, time, place, and situation.  Speech is clear, coherent with normal volume and pattern.  Attention to hygiene is good.  Thought process and thought content coherent, logical, and organized.  Objectively, not distracted by internal or external stimuli.  He denies delusional thinking or paranoia.  He further denies SI, HI, or AVH vital signs reviewed without critical values.  Labs and EKG reviewed as indicated in the treatment plan.  DG pelvis 1-2 views: Radiology of pelvis obtained due to several falls from multiple suicide attempts with results of no acute osseous findings, or significant arthropathic changes noted.  Patient is admitted for safety, medication management, and  Mode of transport to Hospital: Safe transport Current Outpatient (Home) Medication List: See home medication listed PRN medication prior to evaluation: See medication listing  ED course: Lab and EKG were obtained and analyzed Collateral Information: Not obtained at this time POA/Legal Guardian: Pt. is his own legal guardian  Past Psychiatric Hx: Previous Psych Diagnoses: Depression and  anxiety Prior inpatient treatment: Denies Current/prior outpatient treatment:denies Prior rehab hx: denies Psychotherapy hx: Denies History of  suicide: Denies History of homicide or aggression: Denies Psychiatric medication history: Denies Psychiatric medication compliance history: Denies taking any psychotropic medications Neuromodulation history: Denies Current Psychiatrist: Denies Current therapist: Denies  Substance Abuse Hx: Alcohol: Denies drinking alcohol.  Last alcohol drink in 1990 Tobacco: Smokes 1 pack of cigarettes daily Illicit drugs: Denies except for occasional marijuana smoking Rx drug abuse: Denies Rehab hx: Denies  Past Medical History: Medical Diagnoses: Kidney stones Home Rx: Denies denies Prior Hosp: Denies Prior Surgeries/Trauma: Denies Head trauma, LOC, concussions, seizures: Reported history of childhood seizures Allergies: Sulfa Antibiotics  Drug Class Anaphylaxis High Allergy 01/15/2013 Past Updates   LMP: Not applicable Contraception: Not applicable PCP: Denies  Family History: Medical: Pancreatic cancer, heart problems, diabetes, CVA, hypertension. Psych: Mother has history of hypochondriac, brother has manic depressive episode Psych Rx: Unsure SA/HA: Patient unsure  Substance use family hx: Patient unsure  Social History: Childhood (bring, raised, lives now, parents, siblings, schooling, education): Some college Abuse: Denies Marital Status: Single Sexual orientation: Heterosexual Children: No children Employment: Donated plasma for money Peer Group: Denies peer group Housing: Lives in a motel Finances: Some financial difficulty Legal: Denied Scientist, physiological: Denies affiliation with the [employer]  Associated Signs/Symptoms: Depression Symptoms:  depressed mood, anhedonia, insomnia, fatigue, feelings of worthlessness/guilt, difficulty concentrating, hopelessness, suicidal attempt, anxiety, disturbed sleep, (Hypo) Manic Symptoms:  Impulsivity, Irritable Mood, Anxiety Symptoms:  Excessive Worry, Psychotic Symptoms:  Not applicable PTSD Symptoms: NA  Total Time  spent with patient: 1.5 hours  Is the patient at risk to self? Yes.    Has the patient been a risk to self in the past 6 months? Yes.    Has the patient been a risk to self within the distant past?: Is the patient a risk to others? No.  Has the patient been a risk to others in the past 6 months? No.  Has the patient been a risk to others within the distant past? No.   [place] Scale:  Flowsheet Row [facility] (Current[facility] Most recent reading at 05/29/2024  4:58 PM ED from 05/29/2024 in [facility] Emergency Department at [facility] Most recent reading at 05/29/2024  4:56 AM ED from 07/30/2021 in [facility] Emergency Department at [facility] Most recent reading at 07/30/2021  1:55 PM  C-SSRS RISK CATEGORY High Risk High Risk No Risk   Alcohol Screening: Patient refused Alcohol Screening Tool: Yes 1. How often do you have a drink containing alcohol?: Never 2. How many drinks containing alcohol do you have on a typical day when you are drinking?: 1 or 2 3. How often do you have six or more drinks on one occasion?: Never AUDIT-C Score: 0 4. How often during the last year have you found that you were not able to stop drinking once you had started?: Never 5. How often during the last year have you failed to do what was normally expected from you because of drinking?: Never 6. How often during the last year have you needed a first drink in the morning to get yourself going after a heavy drinking session?: Never 7. How often during the last year have you had a feeling of guilt of remorse after drinking?: Never 8. How often during the last year have you been unable to remember what happened the night before because you had been drinking?: Never  9. Have you or someone else been injured as a result of your drinking?: No 10. Has a relative or friend or a doctor or another health worker been concerned about your drinking or  suggested you cut down?: No Alcohol Use Disorder Identification Test Final Score (AUDIT): 0 Alcohol Brief Interventions/Follow-up: Patient Refused  Substance Abuse History in the last 12 months:  Yes.    Consequences of Substance Abuse: Discussed with patient during this admission evaluation.  Medical Consequences: Liver damage, Possible death by overdose  Legal Consequences: Arrests, jail time, Loss of driving privilege.  Family Consequences: Family discord, divorce and or separation.   Previous Psychotropic Medications: No  Psychological Evaluations: Yes  Past Medical History:  Past Medical History:  Diagnosis Date   Seizures (HCC)    Viral meningitis    History reviewed. No pertinent surgical history. Family History: History reviewed. No pertinent family history. Tobacco Screening:  Social History   Tobacco Use  Smoking Status Every Day   Types: Cigars  Smokeless Tobacco Never    BH Tobacco Counseling     Are you interested in Tobacco Cessation Medications?  Yes, implement Nicotene Replacement Protocol Counseled patient on smoking cessation:  Yes Reason Tobacco Screening Not Completed: No value filed.    Social History:  Social History   Substance and Sexual Activity  Alcohol Use No     Social History   Substance and Sexual Activity  Drug Use Not Currently   Types: Marijuana    Additional Social History:    Allergies:   Allergies  Allergen Reactions   Sulfa Antibiotics Anaphylaxis   Lab Results:  Results for orders placed or performed during the hospital encounter of 05/29/24 (from the past 48 hours)  Comprehensive metabolic panel     Status: Abnormal   Collection Time: 05/29/24  5:14 AM  Result Value Ref Range   Sodium 135 135 - 145 mmol/L   Potassium 3.9 3.5 - 5.1 mmol/L   Chloride 103 98 - 111 mmol/L   CO2 19 (L) 22 - 32 mmol/L   Glucose, Bld 91 70 - 99 mg/dL    Comment: Glucose reference range applies only to samples taken after fasting for at  least 8 hours.   BUN 11 6 - 20 mg/dL   Creatinine, Ser 9.34 0.61 - 1.24 mg/dL   Calcium 8.2 (L) 8.9 - 10.3 mg/dL   Total Protein 5.9 (L) 6.5 - 8.1 g/dL   Albumin 3.3 (L) 3.5 - 5.0 g/dL   AST 89 (H) 15 - 41 U/L   ALT 44 0 - 44 U/L   Alkaline Phosphatase 34 (L) 38 - 126 U/L   Total Bilirubin 1.3 (H) 0.0 - 1.2 mg/dL   GFR, Estimated >39 >39 mL/min    Comment: (NOTE) Calculated using the CKD-EPI Creatinine Equation (2021)    Anion gap 13 5 - 15    Comment: Performed at [facility], [address], [place], [place] [address]  Salicylate level     Status: Abnormal   Collection Time: 05/29/24  5:14 AM  Result Value Ref Range   Salicylate Lvl <7.0 (L) 7.0 - 30.0 mg/dL    Comment: Performed at [facility], [address], [place], [place] [address]  Acetaminophen  level     Status: Abnormal   Collection Time: 05/29/24  5:14 AM  Result Value Ref Range   Acetaminophen  (Tylenol ), Serum <10 (L) 10 - 30 ug/mL    Comment: (NOTE) Therapeutic concentrations vary significantly. A  range of 10-30 ug/mL  may be an effective concentration for many patients. However, some  are best treated at concentrations outside of this range. Acetaminophen  concentrations >150 ug/mL at 4 hours after ingestion  and >50 ug/mL at 12 hours after ingestion are often associated with  toxic reactions.  Performed at [facility], [address], [place], [place] [address]   Ethanol     Status: None   Collection Time: 05/29/24  5:14 AM  Result Value Ref Range   Alcohol, Ethyl (B) <15 <15 mg/dL    Comment: (NOTE) For medical purposes only. Performed at [facility], [address] [place], [place] [address]   CBC WITH DIFFERENTIAL     Status: None   Collection Time: 05/29/24  5:14 AM  Result Value Ref Range   WBC 8.1 4.0 - 10.5 K/uL   RBC 4.80 4.22 - 5.81 MIL/uL   Hemoglobin 14.7 13.0 - 17.0 g/dL   HCT 54.8 60.9 - 47.9 %   MCV 94.0  80.0 - 100.0 fL   MCH 30.6 26.0 - 34.0 pg   MCHC 32.6 30.0 - 36.0 g/dL   RDW 87.3 88.4 - 84.4 %   Platelets 215 150 - 400 K/uL   nRBC 0.0 0.0 - 0.2 %   Neutrophils Relative % 76 %   Neutro Abs 6.2 1.7 - 7.7 K/uL   Lymphocytes Relative 15 %   Lymphs Abs 1.2 0.7 - 4.0 K/uL   Monocytes Relative 7 %   Monocytes Absolute 0.6 0.1 - 1.0 K/uL   Eosinophils Relative 1 %   Eosinophils Absolute 0.1 0.0 - 0.5 K/uL   Basophils Relative 0 %   Basophils Absolute 0.0 0.0 - 0.1 K/uL   Immature Granulocytes 1 %   Abs Immature Granulocytes 0.04 0.00 - 0.07 K/uL    Comment: Performed at [facility], [address] [place], [place] [address]  CBG monitoring, ED     Status: None   Collection Time: 05/29/24  5:15 AM  Result Value Ref Range   Glucose-Capillary 94 70 - 99 mg/dL    Comment: Glucose reference range applies only to samples taken after fasting for at least 8 hours.   Comment 1 Notify RN   Urine rapid drug screen (hosp performed)     Status: Abnormal   Collection Time: 05/29/24  9:30 AM  Result Value Ref Range   Opiates NONE DETECTED NONE DETECTED   Cocaine NONE DETECTED NONE DETECTED   Benzodiazepines NONE DETECTED NONE DETECTED   Amphetamines NONE DETECTED NONE DETECTED   Tetrahydrocannabinol POSITIVE (A) NONE DETECTED   Barbiturates NONE DETECTED NONE DETECTED    Comment: (NOTE) DRUG SCREEN FOR MEDICAL PURPOSES ONLY.  IF CONFIRMATION IS NEEDED FOR ANY PURPOSE, NOTIFY LAB WITHIN 5 DAYS.  LOWEST DETECTABLE LIMITS FOR URINE DRUG SCREEN Drug Class                     Cutoff (ng/mL) Amphetamine and metabolites    1000 Barbiturate and metabolites    200 Benzodiazepine                 200 Opiates and metabolites        300 Cocaine and metabolites        300 THC                            50 Performed at [facility],  [address] [place], [place] [address]    Blood Alcohol level:  Lab Results  Component Value Date   [facility] <15 05/29/2024    [facility]  03/18/2008    <5        LOWEST DETECTABLE LIMIT FOR SERUM ALCOHOL IS 11 mg/dL FOR MEDICAL PURPOSES ONLY   Metabolic Disorder Labs:  Lab Results  Component Value Date   HGBA1C  03/18/2008    5.6 (NOTE)   The ADA recommends the following therapeutic goals for glycemic   control related to Hgb A1C measurement:   Goal of Therapy:   < 7.0% Hgb A1C   Action Suggested:  > 8.0% Hgb A1C   Ref:  Diabetes Care, 22, Suppl. 1, 1999   MPG 122 03/18/2008   No results found for: PROLACTIN Lab Results  Component Value Date   CHOL  03/19/2008    193        ATP III CLASSIFICATION:  <200     mg/dL   Desirable  799-760  mg/dL   Borderline High  >=759    mg/dL   High   TRIG 726 (H) 93/98/7990   HDL 25 (L) 03/19/2008   CHOLHDL 7.7 03/19/2008   VLDL 55 (H) 03/19/2008   LDLCALC (H) 03/19/2008    113        Total Cholesterol/HDL:CHD Risk Coronary Heart Disease Risk Table                     Men   Women  1/2 Average Risk   3.4   3.3   Current Medications: Current Facility-Administered Medications  Medication Dose Route Frequency Provider Last Rate Last Admin   acetaminophen  (TYLENOL ) tablet 650 mg  650 mg Oral Q6H PRN Arloa Suzen RAMAN, NP       alum & mag hydroxide-simeth (MAALOX/MYLANTA) 200-200-20 MG/5ML suspension 30 mL  30 mL Oral Q4H PRN Arloa Suzen RAMAN, NP       haloperidol  (HALDOL ) tablet 5 mg  5 mg Oral TID PRN Arloa Suzen RAMAN, NP       And   diphenhydrAMINE  (BENADRYL ) capsule 50 mg  50 mg Oral TID PRN Arloa Suzen RAMAN, NP       haloperidol  lactate (HALDOL ) injection 5 mg  5 mg Intramuscular TID PRN Arloa Suzen RAMAN, NP       And   diphenhydrAMINE  (BENADRYL ) injection 50 mg  50 mg Intramuscular TID PRN Arloa Suzen RAMAN, NP       And   LORazepam  (ATIVAN ) injection 2 mg  2 mg Intramuscular TID PRN Arloa Suzen RAMAN, NP       haloperidol  lactate (HALDOL ) injection 10 mg  10 mg Intramuscular TID PRN Arloa Suzen RAMAN, NP       And   diphenhydrAMINE  (BENADRYL )  injection 50 mg  50 mg Intramuscular TID PRN Arloa Suzen RAMAN, NP       And   LORazepam  (ATIVAN ) injection 2 mg  2 mg Intramuscular TID PRN Arloa Suzen RAMAN, NP       hydrOXYzine  (ATARAX ) tablet 25 mg  25 mg Oral TID PRN Arloa Suzen RAMAN, NP       magnesium  hydroxide (MILK OF MAGNESIA) suspension 30 mL  30 mL Oral Daily PRN Arloa Suzen RAMAN, NP       nicotine  (NICODERM CQ  - dosed in mg/24 hours) patch 21 mg  21 mg Transdermal Q0600 Arloa Suzen RAMAN, NP   21 mg at 05/30/24 0642   traZODone  (DESYREL ) tablet  50 mg  50 mg Oral QHS PRN Arloa Suzen RAMAN, NP       PTA Medications: Medications Prior to Admission  Medication Sig Dispense Refill Last Dose/Taking   acetaminophen  (TYLENOL ) 500 MG tablet Take 1,000 mg by mouth every 6 (six) hours as needed for pain.      AIMS:  ,  ,  ,  ,  ,  ,    Musculoskeletal: Strength & Muscle Tone: within normal limits Gait & Station: normal Patient leans: N/A  Psychiatric Specialty Exam:  Presentation  General Appearance:  Appropriate for Environment; Casual  Eye Contact: Fair  Speech: Clear and Coherent  Speech Volume: Normal  Handedness: Right  Mood and Affect  Mood: Anxious; Depressed  Affect: Congruent  Thought Process  Thought Processes: Coherent  Duration of Psychotic Symptoms:N/A  Past Diagnosis of Schizophrenia or Psychoactive disorder: No data recorded  Descriptions of Associations:Intact  Orientation:No data recorded Thought Content:Logical  Hallucinations:Hallucinations: None  Ideas of Reference:None  Suicidal Thoughts:Suicidal Thoughts: No  Homicidal Thoughts:Homicidal Thoughts: No  Sensorium  Memory: Immediate Fair  Judgment: Fair  Insight: Fair  Art therapist  Concentration: Fair  Attention Span: Fair  Recall: Fair  Fund of Knowledge: Fair  Language: Good  Psychomotor Activity  Psychomotor Activity: Psychomotor Activity: Normal  Assets  Assets: Communication  Skills; Physical Health; Resilience  Sleep  Sleep: Sleep: Good  Estimated Sleeping Duration (Last 24 Hours): 5.00 hours  Physical Exam: Physical Exam Vitals and nursing note reviewed.  Constitutional:      General: He is not in acute distress.    Appearance: He is normal weight. He is not ill-appearing.  HENT:     Head: Normocephalic.     Right Ear: External ear normal.     Left Ear: External ear normal.     Nose: Nose normal.     Mouth/Throat:     Mouth: Mucous membranes are moist.     Pharynx: Oropharynx is clear.  Eyes:     Extraocular Movements: Extraocular movements intact.  Cardiovascular:     Rate and Rhythm: Normal rate.     Pulses: Normal pulses.  Pulmonary:     Effort: Pulmonary effort is normal.  Abdominal:     Comments: Deferred  Genitourinary:    Comments: Deferred Musculoskeletal:        General: Normal range of motion.     Cervical back: Normal range of motion.  Skin:    General: Skin is warm.  Neurological:     General: No focal deficit present.     Mental Status: He is alert and oriented to person, place, and time.  Psychiatric:        Mood and Affect: Mood normal.        Behavior: Behavior normal.    Review of Systems  Constitutional:  Negative for chills and fever.  HENT:  Negative for sore throat.   Eyes:  Negative for blurred vision.  Respiratory:  Negative for cough, sputum production, shortness of breath and wheezing.   Cardiovascular:  Negative for chest pain and palpitations.  Gastrointestinal:  Negative for abdominal pain, constipation, diarrhea, heartburn, nausea and vomiting.  Genitourinary:  Negative for dysuria.  Musculoskeletal:  Negative for falls.  Skin:  Negative for itching and rash.  Neurological:  Negative for dizziness and headaches.  Endo/Heme/Allergies:        See allergy listing  Psychiatric/Behavioral:  Positive for depression and suicidal ideas. Negative for hallucinations. The patient is nervous/anxious. The  patient does not  have insomnia.    Blood pressure 116/77, pulse 78, temperature 98.1 F (36.7 C), temperature source Oral, resp. rate 18, height 5' 9 (1.753 m), weight 95.7 kg, SpO2 99%. Body mass index is 31.16 kg/m.  Treatment Plan Summary: Daily contact with patient to assess and evaluate symptoms and progress in treatment and Medication management  Observation Level/Precautions:  15 minute checks  Laboratory:   CBC with differentials: Within normal limits CMP: CO2 19 low, calcium 8.2 low, alkaline phos 34 low, albumin 3.3 low, AST 89 high, total protein 5.9 low, bilirubin 1.3 high, otherwise normal.  Cardiac profile troponin 1 high-sensitivity less than 2.  Lipid profile: Not obtained.  UDS: Positive for marijuana  New labs ordered: Vitamin B-12, vitamin D  25-hydroxy, TSH, lipid panel, hemoglobin A1c, folic acid, UA  EKG reviewed: Sinus rhythm, ventricular rate 75, QT/QTc 384/429  Psychotherapy: Yes  Medications: See MAR  Consultations: Pending  Discharge Concerns: Safety  Estimated LOS: 3 to 7 days  Other:     Assessment: This is the first admission to this [facility] for this unfortunate 58 year old Caucasian male with prior psychiatric diagnoses significant for MDD recurrent severe, multiple bereavement.  Patient presents voluntarily to [facility] from [place], ED for worsening depression resulting in overdose with 100 pills of Benadryl , attempt to hang himself/electrocute himself in the context of pervasive depression.  BAL: Less than 15.  UDS: Positive for marijuana  Physician Treatment Plan for Primary Diagnosis: MDD (major depressive disorder), single episode, severe , no psychosis (HCC)  Plan: Medications: --Initiate Wellbutrin  XL150 mg p.o. daily for depression --Continue trazodone  tablets 50 mg p.o. daily as needed at bedtime for insomnia --Continue hydroxyzine  tablets 25 mg p.o. 3 times daily as needed for anxiety --Nicotine  patch 21 mg  transdermal every 24 hours for smoking cessation  -- [facility] agitation protocol see MAR  Other PRN Medications  -Acetaminophen  650 mg every 6 as needed/mild pain  -Maalox 30 mL oral every 4 as needed/digestion  -Magnesium  hydroxide 30 mL daily as needed/mild constipation   --The risks/benefits/side-effects/alternatives to this medication were discussed in detail with the patient and time was given for questions. The patient consents to medication trial.   -- Metabolic profile and EKG monitoring obtained while on an atypical antipsychotic (BMI: Lipid Panel: HbgA1c: QTc:)   -- Encouraged patient to participate in unit milieu and in scheduled group therapies   Safety and Monitoring:  Voluntary admission to inpatient psychiatric unit for safety, stabilization and treatment  Daily contact with patient to assess and evaluate symptoms and progress in treatment  Patient's case to be discussed in multi-disciplinary team meeting  Observation Level : q15 minute checks  Vital signs: q12 hours  Precautions: suicide, but pt currently verbally contracts for safety on unit.   Discharge Planning:  Social work and case management to assist with discharge planning and identification of hospital follow-up needs prior to discharge  Estimated LOS: 5-7?days  Discharge Concerns: Need to establisDoes effecxor cause dizziness?  Safety plan; Medication com hi Gail pliance and effectiveness   Discharge Goals: Return home with outpatient referrals for mental health follow-up including medication management/psychotherapy.   Long Term Goal(s): Improvement in symptoms so as ready for discharge  Short Term Goals: Ability to identify changes in lifestyle to reduce recurrence of condition will improve, Ability to verbalize feelings will improve, Ability to disclose and discuss suicidal ideas, Ability to demonstrate self-control will improve, Ability to identify and develop effective coping behaviors will improve, Ability to  maintain clinical measurements within normal limits will improve, Compliance with prescribed medications will improve, and Ability to identify triggers associated with substance abuse/mental health issues will improve  Physician Treatment Plan for Secondary Diagnosis: Principal Problem:   MDD (major depressive disorder), single episode, severe , no psychosis (HCC)  I certify that inpatient services furnished can reasonably be expected to improve the patient's condition.    Chea Malan C Ronalee Scheunemann, FNP 8/12/20259:15 AM

## 2024-05-30 NOTE — Progress Notes (Signed)
(  Sleep Hours) -5.0 (Any PRNs that were needed, meds refused, or side effects to meds)- none (Any disturbances and when (visitation, over night)-none (Concerns raised by the patient)- Pt c/o lt hip pain and is walking with a limp, but refuses to take anything for the pain (SI/HI/AVH)- Denies all

## 2024-05-30 NOTE — Plan of Care (Signed)
   Problem: Education: Goal: Emotional status will improve Outcome: Progressing Goal: Mental status will improve Outcome: Progressing Goal: Verbalization of understanding the information provided will improve Outcome: Progressing

## 2024-05-30 NOTE — BHH Suicide Risk Assessment (Signed)
 Suicide Risk Assessment  Admission Assessment    [facility] Admission Suicide Risk Assessment   Nursing information obtained from:  Patient Demographic factors:  Male, Divorced or widowed, Caucasian, Low socioeconomic status Current Mental Status:  Suicidal ideation indicated by patient Loss Factors:  Decline in physical health Historical Factors:  Prior suicide attempts (once) Risk Reduction Factors:  Religious beliefs about death  Total Time spent with patient: 45 minutes  Principal Problem: MDD (major depressive disorder), single episode, severe , no psychosis (HCC) Diagnosis:  Principal Problem:   MDD (major depressive disorder), single episode, severe , no psychosis (HCC)  Subjective Data:  This is the first admission to this [facility] for this unfortunate 58 year old Caucasian male with prior psychiatric diagnoses significant for MDD recurrent severe, multiple bereavement.  Patient presents voluntarily to [facility] from [place], ED for worsening depression resulting in overdose with 100 pills of Benadryl , attempt to hang himself/electrocute himself in the context of pervasive depression.  BAL: Less than 15.  UDS: Positive for marijuana  Continued Clinical Symptoms:  Alcohol Use Disorder Identification Test Final Score (AUDIT): 0 The Alcohol Use Disorders Identification Test, Guidelines for Use in Primary Care, Second Edition.  World Science writer [facility]). Score between 0-7:  no or low risk or alcohol related problems. Score between 8-15:  moderate risk of alcohol related problems. Score between 16-19:  high risk of alcohol related problems. Score 20 or above:  warrants further diagnostic evaluation for alcohol dependence and treatment.  CLINICAL FACTORS:   Severe Anxiety and/or Agitation Depression:   Anhedonia Hopelessness Impulsivity Insomnia Severe More than one psychiatric diagnosis Medical Diagnoses and  Treatments/Surgeries  Musculoskeletal: Strength & Muscle Tone: within normal limits Gait & Station: normal Patient leans: N/A  Psychiatric Specialty Exam:  Presentation  General Appearance:  Appropriate for Environment; Casual  Eye Contact: Fair  Speech: Clear and Coherent  Speech Volume: Normal  Handedness: Right  Mood and Affect  Mood: Anxious; Depressed  Affect: Congruent  Thought Process  Thought Processes: Coherent  Descriptions of Associations:Intact  Orientation:No data recorded Thought Content:Logical  History of Schizophrenia/Schizoaffective disorder:No data recorded Duration of Psychotic Symptoms:No data recorded Hallucinations:Hallucinations: None  Ideas of Reference:None  Suicidal Thoughts:Suicidal Thoughts: No  Homicidal Thoughts:Homicidal Thoughts: No  Sensorium  Memory: Immediate Fair  Judgment: Fair  Insight: Fair  Art therapist  Concentration: Fair  Attention Span: Fair  Recall: Fair  Fund of Knowledge: Fair  Language: Good  Psychomotor Activity  Psychomotor Activity: Psychomotor Activity: Normal  Assets  Assets: Communication Skills; Physical Health; Resilience  Sleep  Sleep: Sleep: Good Number of Hours of Sleep: 5  Physical Exam: Physical Exam Vitals and nursing note reviewed.  HENT:     Head: Normocephalic.     Right Ear: External ear normal.     Left Ear: External ear normal.     Nose: Nose normal.     Mouth/Throat:     Mouth: Mucous membranes are moist.     Pharynx: Oropharynx is clear.  Cardiovascular:     Rate and Rhythm: Normal rate.     Pulses: Normal pulses.  Pulmonary:     Effort: Pulmonary effort is normal.  Abdominal:     Comments: Deferred  Genitourinary:    Comments: Deferred Musculoskeletal:        General: Normal range of motion.     Cervical back: Normal range of motion.  Skin:    General: Skin is warm.  Neurological:     General: No focal  deficit present.      Mental Status: He is alert and oriented to person, place, and time.  Psychiatric:        Mood and Affect: Mood normal.        Behavior: Behavior normal.    Review of Systems  Constitutional:  Negative for chills and fever.  HENT:  Negative for sore throat.   Eyes:  Negative for blurred vision.  Respiratory:  Negative for cough, sputum production, shortness of breath and wheezing.   Cardiovascular:  Negative for chest pain and palpitations.  Gastrointestinal:  Negative for abdominal pain, constipation, diarrhea, heartburn, nausea and vomiting.  Musculoskeletal:  Negative for falls.       Complaint of left hip pain due to fall prior to admission to the hospital  Skin:  Negative for itching and rash.  Neurological:  Negative for dizziness and headaches.  Endo/Heme/Allergies:        See allergy listed  Psychiatric/Behavioral:  Positive for depression. Negative for hallucinations, substance abuse and suicidal ideas. The patient is nervous/anxious. The patient does not have insomnia.    Blood pressure 116/77, pulse 78, temperature 98.1 F (36.7 C), temperature source Oral, resp. rate 18, height 5' 9 (1.753 m), weight 95.7 kg, SpO2 99%. Body mass index is 31.16 kg/m.   COGNITIVE FEATURES THAT CONTRIBUTE TO RISK:  Polarized thinking    SUICIDE RISK:   Severe:  Frequent, intense, and enduring suicidal ideation, specific plan, no subjective intent, but some objective markers of intent (i.e., choice of lethal method), the method is accessible, some limited preparatory behavior, evidence of impaired self-control, severe dysphoria/symptomatology, multiple risk factors present, and few if any protective factors, particularly a lack of social support.  PLAN OF CARE: Treatment Plan Summary: Daily contact with patient to assess and evaluate symptoms and progress in treatment and Medication management  Observation Level/Precautions:  15 minute checks  Laboratory:   CBC with differentials: Within  normal limits CMP: CO2 19 low, calcium 8.2 low, alkaline phos 34 low, albumin 3.3 low, AST 89 high, total protein 5.9 low, bilirubin 1.3 high, otherwise normal.  Cardiac profile troponin 1 high-sensitivity less than 2.  Lipid profile: Not obtained.  UDS: Positive for marijuana  New labs ordered: Vitamin B-12, vitamin D  25-hydroxy, TSH, lipid panel, hemoglobin A1c, folic acid, UA  EKG reviewed: Sinus rhythm, ventricular rate 75, QT/QTc 384/429  Psychotherapy: Yes  Medications: See MAR  Consultations: Pending  Discharge Concerns: Safety  Estimated LOS: 3 to 7 days  Other:     Assessment: This is the first admission to this [place] [facility] for this unfortunate 57 year old Caucasian male with prior psychiatric diagnoses significant for MDD recurrent severe, multiple bereavement.  Patient presents voluntarily to [facility] from [place], ED for worsening depression resulting in overdose with 100 pills of Benadryl , attempt to hang himself/electrocute himself in the context of pervasive depression.  BAL: Less than 15.  UDS: Positive for marijuana  Physician Treatment Plan for Primary Diagnosis: MDD (major depressive disorder), single episode, severe , no psychosis (HCC)  Plan: Medications: --Initiate Wellbutrin  XL150 mg p.o. daily for depression --Continue trazodone  tablets 50 mg p.o. daily as needed at bedtime for insomnia --Continue hydroxyzine  tablets 25 mg p.o. 3 times daily as needed for anxiety -- Nicotine  patch 21 mg transdermal every 24 hours for smoking cessation  -- BHH agitation protocol see MAR  Other PRN Medications  -Acetaminophen  650 mg every 6 as needed/mild pain  -Maalox 30 mL oral every 4 as  needed/digestion  -Magnesium  hydroxide 30 mL daily as needed/mild constipation   --The risks/benefits/side-effects/alternatives to this medication were discussed in detail with the patient and time was given for questions. The patient consents to medication trial.   --  Metabolic profile and EKG monitoring obtained while on an atypical antipsychotic (BMI: Lipid Panel: HbgA1c: QTc:)   -- Encouraged patient to participate in unit milieu and in scheduled group therapies   Safety and Monitoring:  Voluntary admission to inpatient psychiatric unit for safety, stabilization and treatment  Daily contact with patient to assess and evaluate symptoms and progress in treatment  Patient's case to be discussed in multi-disciplinary team meeting  Observation Level : q15 minute checks  Vital signs: q12 hours  Precautions: suicide, but pt currently verbally contracts for safety on unit.   Discharge Planning:  Social work and case management to assist with discharge planning and identification of hospital follow-up needs prior to discharge  Estimated LOS: 5-7?days  Discharge Concerns: Need to establisDoes effecxor cause dizziness?  Safety plan; Medication com hi Gail pliance and effectiveness   Discharge Goals: Return home with outpatient referrals for mental health follow-up including medication management/psychotherapy.   Long Term Goal(s): Improvement in symptoms so as ready for discharge  Short Term Goals: Ability to identify changes in lifestyle to reduce recurrence of condition will improve, Ability to verbalize feelings will improve, Ability to disclose and discuss suicidal ideas, Ability to demonstrate self-control will improve, Ability to identify and develop effective coping behaviors will improve, Ability to maintain clinical measurements within normal limits will improve, Compliance with prescribed medications will improve, and Ability to identify triggers associated with substance abuse/mental health issues will improve  Physician Treatment Plan for Secondary Diagnosis: Principal Problem:   MDD (major depressive disorder), single episode, severe , no psychosis (HCC)  I certify that inpatient services furnished can reasonably be expected to improve the patient's  condition.   Ellouise JAYSON Azure, FNP 05/30/2024, 9:10 AM

## 2024-05-30 NOTE — BHH Counselor (Signed)
 Adult Comprehensive Assessment  Patient ID: Bruce Wells, male   DOB: 11-21-1965, 58 y.o.   MRN: [id number]  Information Source: Information source: Patient  Current Stressors:  Patient states their primary concerns and needs for treatment are:: A suicide attempt. I'm realizing I lost a lot of people in 2020 within a 5 month period. My wife, friends, brother-in-law and my father. I'm realizing that I never grieved them and it's been hard. I was recently promised a job but it didn't come through and all of that compiled so I thought the best thing at that moment was for me to end my life. I attempted 3 ways: I took a bunch of sleeping pills, tried to hang myself, then tried to electrocute myself but nothing worked. That's why I think it wasn't meant to happen, I'm supposed to be here. Patient states their goals for this hospitilization and ongoing recovery are:: I want to make sure my cats are okay, they are in the motel. I also think it would be good to spend time talking to a therapist. I want to get everything together for that. 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 Family Relationships: None reported Financial / Lack of resources (include bankruptcy): None reported Housing / Lack of housing: Patient reported currently residing at a [employer] but stated he will run out of money soon so he's hoping they can work with him until he receives his next check. Physical health (include injuries & life threatening diseases): Patient reported recently falling during suicide attempt causing bruising on his shoulders and pain to his legs. Social relationships: None reported Substance abuse: None reported Bereavement / Loss: Patient reported the death of many loved ones close to him during 2020 pandemic. He stated he never grieved their  deaths which is impacting his life now. Patient reported he would benefit from speaking with a therapist on a regular basis.  Living/Environment/Situation:  Living Arrangements: Alone Living conditions (as described by patient or guardian): at a hotel Who else lives in the home?: It's just me and my 2 cats How long has patient lived in current situation?: since March 2025 What is atmosphere in current home: Comfortable  Family History:  Marital status: Single Widowed, when?: Patient reported the death of his common law wife who passed in [id number]. Are you sexually active?: No What is your sexual orientation?: Heterosexual Has your sexual activity been affected by drugs, alcohol, medication, or emotional stress?: No Does patient have children?: No  Childhood History:  By whom was/is the patient raised?: Mother Additional childhood history information: Patient reported having a difficult relationship with his mother his entire life due to her mental health disorder; brother dx manic depressive disorder; no relationship with father Description of patient's relationship with caregiver when they were a child: It was full of ups and downs, we didn't have the best relationship when I was growing up but I took care of her during her last 10 years of life when she got sick Patient's description of current relationship with people who raised him/her: Patient reported both parents are deceased How were you disciplined when you got in trouble as a child/adolescent?: I got a talkin' to Does patient have siblings?: Yes Number of Siblings: 1 Description of patient's current relationship with siblings: Pt reported his brother died  in 2006; mom died in 2009 Did patient suffer any verbal/emotional/physical/sexual abuse as a child?: No Did patient suffer from severe childhood neglect?: No Has patient ever been sexually abused/assaulted/raped as an adolescent or adult?: No Was the patient ever a  victim of a crime or a disaster?: No Witnessed domestic violence?: No Has patient been affected by domestic violence as an adult?: No  Education:  Highest grade of school patient has completed: 12th grade Currently a student?: No Learning disability?: No  Employment/Work Situation:   Patient's Job has Been Impacted by Current Illness: Yes Describe how Patient's Job has Been Impacted: due to patient's current hospitalization, he reported he's unsure if he'd be able to return to job/if it's going to be available upon discharge but he is hopeful. What is the Longest Time Patient has Held a Job?: 6 months Where was the Patient Employed at that Time?: [employer] Has Patient ever Been in the [employer]?: No  Financial Resources:   Financial resources: Income from employment Does patient have a representative payee or guardian?: No  Alcohol/Substance Abuse:   What has been your use of drugs/alcohol within the last 12 months?: some alcohol consumption If attempted suicide, did drugs/alcohol play a role in this?: Yes (Pt reported attempting to end his life by way of overdosing on sleeping medications) Alcohol/Substance Abuse Treatment Hx: Denies past history If yes, describe treatment: N/A Has alcohol/substance abuse ever caused legal problems?: No  Social Support System:   Forensic psychologist System: None Describe Community Support System: Patient denied having a support system Type of faith/religion: christian How does patient's faith help to cope with current illness?: I haven't been coping but maybe it's time now  Leisure/Recreation:   Do You Have Hobbies?: No  Strengths/Needs:   What is the patient's perception of their strengths?: I'm a good handyman, I am a Chief Executive Officer. Patient states they can use these personal strengths during their treatment to contribute to their recovery: It'll keep me focused and on track, especially working Patient states these  barriers may affect/interfere with their treatment: None reported Patient states these barriers may affect their return to the community: I need some sort of assistance regarding trying to get help paying for my motel Other important information patient would like considered in planning for their treatment: None reported  Discharge Plan:   Currently receiving community mental health services: No Patient states concerns and preferences for aftercare planning are: going back to motel; patient reported he's been living at the [facility] [address] at [address]. [place], [place] [address] since March 2025. Patient states they will know when they are safe and ready for discharge when: I don't know, maybe after I've gone to a few more groups and gotten myself together. Does patient have access to transportation?: Yes Does patient have financial barriers related to discharge medications?: Yes Patient description of barriers related to discharge medications: Patient reports current job is in limbo due to his hospitalization and he is uninsured so he's not sure if he'd be able to afford medications. Will patient be returning to same living situation after discharge?: Yes  Summary/Recommendations:   Summary and Recommendations (to be completed by the evaluator): Bruce Wells is a 58 year old male who was voluntarily admitted from [facility] to [facility] due to a suicide attempt. Patient reported attempting to take his life by way of overdosing on sleeping pills, hanging himself, and attemping to electrocute himself. Stressors include the death  of loved one's during the 2020 pandemic. He reported not ever grieving the losses of his partner, brother-in-law, father, and 3 friends. Another stressor is work and his job not following through on promises of a promotion and monetary raise. Patient endorsed the occasional consumption of alcohol but denied the use of other illicit,  mood-altering substances. Patient's urinary drug screen was positive for THC. Upon assessment patient was tearful and emotional when discussing the death of his loved one's and his inability to grieve 5 years ago. Patient denied having current outpatient mental heatlh providers but will be provided with follow-up appointments prior to discharge.While here, [place] can benefit from crisis stabilization, medication management, therapeutic milieu, and referrals for services.   Raydell Maners M Caitlin Ainley, LCSWA 05/30/2024

## 2024-05-30 NOTE — BHH Group Notes (Signed)
 Psychoeducational Group Note  Date:  05/30/2024 Time:  2130  Group Topic/Focus:  Wrap-Up Group:   The focus of this group is to help patients review their daily goal of treatment and discuss progress on daily workbooks.  Participation Level: Did Not Attend  Participation Quality:  Not Applicable  Affect:  Not Applicable  Cognitive:  Not Applicable  Insight:  Not Applicable  Engagement in Group: Not Applicable  Additional Comments:  The patient did not attend group this evening.   Blease Capaldi S 05/30/2024, 9:31 PM

## 2024-05-30 NOTE — Progress Notes (Signed)
   05/30/24 1346  Psychosocial Assessment  Patient Complaints Depression  Eye Contact Fair  Facial Expression Animated  Affect Appropriate to circumstance  Speech Logical/coherent  Interaction Assertive  Motor Activity Slow;Other (Comment) (limp from a fall prior admission)  Appearance/Hygiene In scrubs  Behavior Characteristics Appropriate to situation;Cooperative  Mood Pleasant  Thought Process  Coherency WDL  Content WDL  Delusions None reported or observed  Perception WDL  Hallucination None reported or observed  Judgment WDL  Confusion None  Danger to Self  Current suicidal ideation? Denies  Danger to Others  Danger to Others None reported or observed

## 2024-05-30 NOTE — BHH Group Notes (Signed)
 Adult Psychoeducational Group Note  Date:  05/30/2024 Time:  9:36 AM  Group Topic/Focus:  Goals Group:   The focus of this group is to help patients establish daily goals to achieve during treatment and discuss how the patient can incorporate goal setting into their daily lives to aide in recovery. Orientation:   The focus of this group is to educate the patient on the purpose and policies of crisis stabilization and provide a format to answer questions about their admission.  The group details unit policies and expectations of patients while admitted.  Participation Level:  Active  Participation Quality:  Appropriate  Affect:  Appropriate  Cognitive:  Appropriate  Insight: Appropriate  Engagement in Group:  Engaged  Modes of Intervention:  Discussion  Additional Comments:  Pt attended the goals group and remained appropriate and engaged throughout the duration of the group.   Tyniah Kastens O 05/30/2024, 9:36 AM

## 2024-05-31 ENCOUNTER — Encounter (HOSPITAL_COMMUNITY): Payer: Self-pay

## 2024-05-31 LAB — RPR: RPR Ser Ql: NONREACTIVE

## 2024-05-31 LAB — HEMOGLOBIN A1C
Hgb A1c MFr Bld: 5.4 % (ref 4.8–5.6)
Mean Plasma Glucose: 108 mg/dL

## 2024-05-31 MED ORDER — IBUPROFEN 600 MG PO TABS: 600.0000 mg | ORAL_TABLET | Freq: Four times a day (QID) | ORAL | Status: DC | PRN

## 2024-05-31 MED ORDER — MELATONIN 3 MG PO TABS
3.0000 mg | ORAL_TABLET | Freq: Every evening | ORAL | Status: DC | PRN
Start: 2024-05-31 — End: 2024-06-02
  Administered 2024-05-31 (×2): 3 mg via ORAL
  Filled 2024-05-31: qty 1
  Filled 2024-05-31: qty 7

## 2024-05-31 NOTE — Progress Notes (Signed)
 CSW provided patient with [place] Medicaid application as he has interest in applying. Informed patient he would need to mail application to [facility] once completed which patient stated he'd be able to do. CSW also provided patient with phone number for [employer] where he's currently residing so he's able to check on his belongings and 2 cats. Pt reported staff have been looking after his cats since his hospitalization began. CSW provided patient with list of local shelters since he isn't sure what his housing situation will look like after discharge.    Ramzi Brathwaite, LCSWA 05/31/24 10:23am

## 2024-05-31 NOTE — BHH Group Notes (Signed)
 BHH Group Notes:  (Nursing/MHT/Case Management/Adjunct)  Date:  05/31/2024  Time:  8:50 PM  Type of Therapy:  NA Group  Participation Level:  Did Not Attend  Participation Quality:    Affect:    Cognitive:    Insight:    Engagement in Group:    Modes of Intervention:    Summary of Progress/Problems: Refused to attend group.  Grayce LITTIE Essex 05/31/2024, 8:50 PM

## 2024-05-31 NOTE — Plan of Care (Signed)
 ?  Problem: Education: ?Goal: Mental status will improve ?Outcome: Progressing ?Goal: Verbalization of understanding the information provided will improve ?Outcome: Progressing ?  ?

## 2024-05-31 NOTE — BH Assessment (Signed)
(  Sleep Hours) - 7.5 (Any PRNs that were needed, meds refused, or side effects to meds)- Trazodone  50 mg PO. (Any disturbances and when (visitation, over night)- None (Concerns raised by the patient)- None (SI/HI/AVH)- Denies

## 2024-05-31 NOTE — Progress Notes (Signed)
 Tour of Duty:  Prentice JINNY Angle, RN, 05/31/24, Tour of Duty: 0700-1900  SI/HI/AVH: Denies  Self-Reported   Mood: Positive  Anxiety: Denies, but observed Depression: Denies Irritability: Denies  Broset  Violence Prevention Guidelines *See Row Information*: Small Violence Risk interventions implemented   LBM  Last BM Date : 05/31/24   Pain: present, interventions include: heat packs, PRN Rx refused  Patient Refusals (including Rx): No  Shift Summary: Patient observed to be anxious on unit. Patient able to make needs known. Patient observed to engage appropriately with staff and peers. Patient taking medications as prescribed. This shift, no PRN medication requested or required. No observed or reported side effects to medication. No observed or reported agitation, aggression, or other acute emotional distress. No observed or reported physical abnormalities or concerns, except for unsteadiness this morning. Patient reports feeling out of it reports he had not taken trazodone  prior to last night. Patient observed to be unsteady with delayed response this morning per prior shift. Order for patient to be able to use his phone today, patient remained appropriate with phone use and communicated his actions on screen to this nurse.   Last Vitals  Vitals Weight: 95.7 kg Temp: 98.1 F (36.7 C) Temp Source: Oral Pulse Rate: 81 Resp: 18 BP: 121/76 Patient Position: (not recorded)  Admission Type  Psych Admission Type (Psych Patients Only) Admission Status: Voluntary Date 72 hour document signed : (not recorded) Time 72 hour document signed : (not recorded) Provider Notified (First and Last Name) (see details for LINK to note): (not recorded)   Psychosocial Assessment  Psychosocial Assessment Patient Complaints: Worrying Eye Contact: Fair Facial Expression: Other (Comment) (WDL) Affect: Appropriate to circumstance Speech: Logical/coherent Interaction: Cautious Motor Activity:  Slow, Unsteady Appearance/Hygiene: Unremarkable Behavior Characteristics: Appropriate to situation, Cooperative Mood: Pleasant   Aggressive Behavior  Targets: (not recorded)   Thought Process  Thought Process Coherency: Within Defined Limits Content: Within Defined Limits Delusions: None reported or observed Perception: Within Defined Limits Hallucination: None reported or observed Judgment: Limited Confusion: None  Danger to Self/Others  Danger to Self Current suicidal ideation?: Denies Description of Suicide Plan: (not recorded) Self-Injurious Behavior: (not recorded) Agreement Not to Harm Self: (not recorded) Description of Agreement: (not recorded) Danger to Others: None reported or observed

## 2024-05-31 NOTE — Group Note (Deleted)
 Date:  05/31/2024 Time:  3:31 AM  Group Topic/Focus:  Coping With Mental Health Crisis:   The purpose of this group is to help patients identify strategies for coping with mental health crisis.  Group discusses possible causes of crisis and ways to manage them effectively.     Participation Level:  {BHH PARTICIPATION OZCZO:77735}  Participation Quality:  {BHH PARTICIPATION QUALITY:22265}  Affect:  {BHH AFFECT:22266}  Cognitive:  {BHH COGNITIVE:22267}  Insight: {BHH Insight2:20797}  Engagement in Group:  {BHH ENGAGEMENT IN GROUP:22268}  Modes of Intervention:  {BHH MODES OF INTERVENTION:22269}  Additional Comments:  ***  Bruce Wells 05/31/2024, 3:31 AM

## 2024-05-31 NOTE — Progress Notes (Signed)
 [name] Progress Note  05/31/2024 2:15 PM Bruce Wells  MRN:  [id number]    Principal Problem: MDD (major depressive disorder), single episode, severe , no psychosis (HCC) Diagnosis: Principal Problem:   MDD (major depressive disorder), single episode, severe , no psychosis (HCC)  Reason for admission:  This is the first admission to this [facility] for this unfortunate 59 year old Caucasian male with prior psychiatric diagnoses significant for MDD recurrent severe, and multiple bereavements.  Patient presents voluntarily to [facility] from [place], ED for worsening depression resulting in overdose with 100 pills of Benadryl , attempt to hang himself/electrocute himself in the context of pervasive depression.  BAL: Less than 15.  UDS: Positive for marijuana.  24-hour Chart Review: Patient case discussed in the interdisciplinary team meeting.  Vital signs without critical values.  PRNs of Tylenol  required at home left hip pain and left shoulder pain.  Today's assessment notes: On assessment today, the pt reports that his mood is less depressed, and says my outlook is better compared to when I came in at admission.  I think the Wellbutrin  is working well for my  mood.  Patient denies any side effects from the psychotropic medication.  He denies delusional thinking or paranoia.  Further denies SI, HI, AVH.  Observed attending and participating therapeutic milieu and unit group activities. Reports that anxiety is at manageable level Sleep is stable.  Requesting trazodone  to be discontinued due to feeling woozy in the morning.  Melatonin 3 mg p.o. q. nightly as needed initiated for insomnia. Appetite is good Concentration is improving Energy level is adequate Denies suicidal thoughts.  Further denies suicidal intent and plan.  Denies having any HI.  Denies having psychotic symptoms.   Denies having side effects to current psychiatric medications.   We discussed  compliance to current medication regimen.    Time spent with patient: 45 minutes  Past Psychiatric History: Previous Psych Diagnoses: Depression and anxiety Prior inpatient treatment: Denies Current/prior outpatient treatment:denies Prior rehab hx: denies Psychotherapy hx: Denies History of suicide: Denies History of homicide or aggression: Denies Psychiatric medication history: Denies Psychiatric medication compliance history: Denies taking any psychotropic medications Neuromodulation history: Denies Current Psychiatrist: Denies Current therapist: Denies   Past Medical History:  Past Medical History:  Diagnosis Date   Seizures (HCC)    Viral meningitis    History reviewed. No pertinent surgical history. Family History: History reviewed. No pertinent family history. Family Psychiatric  History: See H&P Social History:  Social History   Substance and Sexual Activity  Alcohol Use No     Social History   Substance and Sexual Activity  Drug Use Not Currently   Types: Marijuana    Social History   Socioeconomic History   Marital status: Single    Spouse name: Not on file   Number of children: Not on file   Years of education: Not on file   Highest education level: Not on file  Occupational History   Not on file  Tobacco Use   Smoking status: Every Day    Types: Cigars   Smokeless tobacco: Never  Vaping Use   Vaping status: Never Used  Substance and Sexual Activity   Alcohol use: No   Drug use: Not Currently    Types: Marijuana   Sexual activity: Not Currently  Other Topics Concern   Not on file  Social History Narrative   Not on file   Social Drivers of Health   Financial Resource Strain:  Not on file  Food Insecurity: Food Insecurity Present (05/29/2024)   Hunger Vital Sign    Worried About Running Out of Food in the Last Year: Often true    Ran Out of Food in the Last Year: Often true  Transportation Needs: Unmet Transportation Needs (05/29/2024)    PRAPARE - Administrator, Civil Service (Medical): Yes    Lack of Transportation (Non-Medical): Yes  Physical Activity: Not on file  Stress: Not on file  Social Connections: Not on file   Additional Social History:     Sleep: Good Estimated Sleeping Duration (Last 24 Hours): 7.00-7.25 hours  Appetite:  Good  Current Medications: Current Facility-Administered Medications  Medication Dose Route Frequency Provider Last Rate Last Admin   acetaminophen  (TYLENOL ) tablet 650 mg  650 mg Oral Q6H PRN Arloa Suzen RAMAN, NP   650 mg at 05/30/24 1507   alum & mag hydroxide-simeth (MAALOX/MYLANTA) 200-200-20 MG/5ML suspension 30 mL  30 mL Oral Q4H PRN Arloa Suzen RAMAN, NP       buPROPion  (WELLBUTRIN  XL) 24 hr tablet 150 mg  150 mg Oral Daily Tabor Denham C, FNP   150 mg at 05/31/24 0915   haloperidol  (HALDOL ) tablet 5 mg  5 mg Oral TID PRN Arloa Suzen RAMAN, NP       And   diphenhydrAMINE  (BENADRYL ) capsule 50 mg  50 mg Oral TID PRN Arloa Suzen RAMAN, NP       haloperidol  lactate (HALDOL ) injection 5 mg  5 mg Intramuscular TID PRN Arloa Suzen RAMAN, NP       And   diphenhydrAMINE  (BENADRYL ) injection 50 mg  50 mg Intramuscular TID PRN Arloa Suzen RAMAN, NP       And   LORazepam  (ATIVAN ) injection 2 mg  2 mg Intramuscular TID PRN Arloa Suzen RAMAN, NP       haloperidol  lactate (HALDOL ) injection 10 mg  10 mg Intramuscular TID PRN Arloa Suzen RAMAN, NP       And   diphenhydrAMINE  (BENADRYL ) injection 50 mg  50 mg Intramuscular TID PRN Arloa Suzen RAMAN, NP       And   LORazepam  (ATIVAN ) injection 2 mg  2 mg Intramuscular TID PRN Arloa Suzen RAMAN, NP       hydrOXYzine  (ATARAX ) tablet 25 mg  25 mg Oral TID PRN Arloa Suzen RAMAN, NP       magnesium  hydroxide (MILK OF MAGNESIA) suspension 30 mL  30 mL Oral Daily PRN Arloa Suzen RAMAN, NP       nicotine  (NICODERM CQ  - dosed in mg/24 hours) patch 21 mg  21 mg Transdermal Q0600 Arloa Suzen RAMAN, NP   21 mg at 05/31/24 9366    traZODone  (DESYREL ) tablet 50 mg  50 mg Oral QHS PRN Arloa Suzen RAMAN, NP   50 mg at 05/30/24 2102   Lab Results:  Results for orders placed or performed during the hospital encounter of 05/29/24 (from the past 48 hours)  TSH     Status: None   Collection Time: 05/30/24  6:20 PM  Result Value Ref Range   TSH 1.171 0.350 - 4.500 uIU/mL    Comment: Performed by a 3rd Generation assay with a functional sensitivity of <=0.01 uIU/mL. Performed at [facility], [address], [place], [place] [address]   VITAMIN D  25 Hydroxy (Vit-D Deficiency, Fractures)     Status: Abnormal   Collection Time: 05/30/24  6:20 PM  Result Value Ref Range   Vit  D, 25-Hydroxy 11.54 (L) 30 - 100 ng/mL    Comment: (NOTE) Vitamin D  deficiency has been defined by the Institute of Medicine  and an Endocrine Society practice guideline as a level of serum 25-OH  vitamin D  less than 20 ng/mL (1,2). The Endocrine Society went on to  further define vitamin D  insufficiency as a level between 21 and 29  ng/mL (2).  1. IOM (Institute of Medicine). 2010. Dietary reference intakes for  calcium and D. [place]: [employer]. 2. Holick MF, Binkley [place], Bischoff-Ferrari HA, et al. Evaluation,  treatment, and prevention of vitamin D  deficiency: an Endocrine  Society clinical practice guideline, JCEM. 2011 Jul; 96(7): 1911-30.  Performed at [facility], [address], [place], [place] [address]   Vitamin B12     Status: Abnormal   Collection Time: 05/30/24  6:20 PM  Result Value Ref Range   Vitamin B-12 159 (L) 180 - 914 pg/mL    Comment: (NOTE) This assay is not validated for testing neonatal or myeloproliferative syndrome specimens for Vitamin B12 levels. Performed at [facility], [address], [place], [place] [address]   RPR     Status: None   Collection Time: 05/30/24  6:20 PM  Result Value Ref Range   RPR Ser Ql NON REACTIVE NON REACTIVE     Comment: Performed at [facility], [address], [place], [place] [address]  HIV Antibody (routine testing w rflx)     Status: None   Collection Time: 05/30/24  6:20 PM  Result Value Ref Range   HIV Screen 4th Generation wRfx Non Reactive Non Reactive    Comment: Performed at [facility], [address], [place], [place] [address]   Blood Alcohol level:  Lab Results  Component Value Date   [facility] <15 05/29/2024   [facility]  03/18/2008    <5        LOWEST DETECTABLE LIMIT FOR SERUM ALCOHOL IS 11 mg/dL FOR MEDICAL PURPOSES ONLY   Metabolic Disorder Labs: Lab Results  Component Value Date   HGBA1C  03/18/2008    5.6 (NOTE)   The ADA recommends the following therapeutic goals for glycemic   control related to Hgb A1C measurement:   Goal of Therapy:   < 7.0% Hgb A1C   Action Suggested:  > 8.0% Hgb A1C   Ref:  Diabetes Care, 22, Suppl. 1, 1999   MPG 122 03/18/2008   No results found for: PROLACTIN Lab Results  Component Value Date   CHOL  03/19/2008    193        ATP III CLASSIFICATION:  <200     mg/dL   Desirable  799-760  mg/dL   Borderline High  >=759    mg/dL   High   TRIG 726 (H) 93/98/7990   HDL 25 (L) 03/19/2008   CHOLHDL 7.7 03/19/2008   VLDL 55 (H) 03/19/2008   LDLCALC (H) 03/19/2008    113        Total Cholesterol/HDL:CHD Risk Coronary Heart Disease Risk Table                     Men   Women  1/2 Average Risk   3.4   3.3   Physical Findings: AIMS:  ,  ,  ,  ,  ,  ,   CIWA:    COWS:     Musculoskeletal: Strength & Muscle Tone: within normal limits Gait &  Station: normal Patient leans: N/A  Psychiatric Specialty Exam:  Presentation  General Appearance:  Appropriate for Environment; Casual  Eye Contact: Good  Speech: Clear and Coherent  Speech Volume: Normal  Handedness: Right  Mood and Affect  Mood: Anxious; Depressed  Affect: Congruent  Thought Process  Thought Processes: Coherent  Descriptions of  Associations:Intact  Orientation:Full (Time, Place and Person)  Thought Content:Logical  History of Schizophrenia/Schizoaffective disorder:No data recorded Duration of Psychotic Symptoms:No data recorded Hallucinations:Hallucinations: None  Ideas of Reference:None  Suicidal Thoughts:Suicidal Thoughts: No  Homicidal Thoughts:Homicidal Thoughts: No  Sensorium  Memory: Immediate Good; Recent Good  Judgment: Fair  Insight: Fair  Art therapist  Concentration: Good  Attention Span: Good  Recall: Fair  Fund of Knowledge: Fair  Language: Good  Psychomotor Activity  Psychomotor Activity: Psychomotor Activity: Normal  Assets  Assets: Communication Skills; Physical Health; Resilience  Sleep  Sleep: Sleep: Good Number of Hours of Sleep: 7.5  Physical Exam: Physical Exam Vitals and nursing note reviewed.  Constitutional:      General: He is not in acute distress.    Appearance: He is normal weight. He is not ill-appearing.  HENT:     Head: Normocephalic.     Right Ear: External ear normal.     Left Ear: External ear normal.     Nose: Nose normal.     Mouth/Throat:     Mouth: Mucous membranes are moist.     Pharynx: Oropharynx is clear.  Eyes:     Extraocular Movements: Extraocular movements intact.  Cardiovascular:     Rate and Rhythm: Normal rate.     Pulses: Normal pulses.  Pulmonary:     Effort: Pulmonary effort is normal. No respiratory distress.  Abdominal:     Comments: Deferred  Genitourinary:    Comments: Deferred Musculoskeletal:        General: Normal range of motion.     Cervical back: Normal range of motion.     Comments: Left hip and left shoulder pain sustained during attempt to hang self.  Skin:    General: Skin is warm.  Neurological:     General: No focal deficit present.     Mental Status: He is alert and oriented to person, place, and time.  Psychiatric:        Mood and Affect: Mood normal.        Behavior:  Behavior normal.        Thought Content: Thought content normal.    Review of Systems  Constitutional:  Negative for chills and fever.  HENT:  Negative for sore throat.   Eyes:  Negative for blurred vision.  Respiratory:  Negative for cough, sputum production, shortness of breath and wheezing.   Cardiovascular:  Negative for chest pain and palpitations.  Gastrointestinal:  Negative for abdominal pain, constipation, diarrhea, heartburn, nausea and vomiting.  Genitourinary:  Negative for dysuria, frequency and urgency.  Musculoskeletal:  Negative for falls.  Skin:  Negative for itching and rash.  Neurological:  Negative for dizziness and headaches.  Endo/Heme/Allergies:        See allergy listing  Psychiatric/Behavioral:  Positive for depression. Negative for hallucinations, substance abuse and suicidal ideas. The patient is nervous/anxious. The patient does not have insomnia.    Blood pressure 122/82, pulse 79, temperature 98.1 F (36.7 C), temperature source Oral, resp. rate 18, height 5' 9 (1.753 m), weight 95.7 kg, SpO2 97%. Body mass index is 31.16 kg/m.  Treatment Plan Summary: Daily contact with patient to assess  and evaluate symptoms and progress in treatment and Medication management Observation Level/Precautions:  15 minute checks  Laboratory:   CBC with differentials: Within normal limits CMP: CO2 19 low, calcium 8.2 low, alkaline phos 34 low, albumin 3.3 low, AST 89 high, total protein 5.9 low, bilirubin 1.3 high, otherwise normal.  Cardiac profile troponin 1 high-sensitivity less than 2.  Lipid profile: Not obtained.  UDS: Positive for marijuana   New labs ordered: Vitamin B-12, vitamin D  25-hydroxy, TSH, lipid panel, hemoglobin A1c, folic acid, UA   EKG reviewed: Sinus rhythm, ventricular rate 75, QT/QTc 384/429  Psychotherapy: Yes  Medications: See MAR  Consultations: Pending  Discharge Concerns: Safety  Estimated LOS: 3 to 7 days  Other:      Assessment: This is  the first admission to this [facility] for this unfortunate 58 year old Caucasian male with prior psychiatric diagnoses significant for MDD recurrent severe, multiple bereavement.  Patient presents voluntarily to [facility] from [place], ED for worsening depression resulting in overdose with 100 pills of Benadryl , attempt to hang himself/electrocute himself in the context of pervasive depression.  BAL: Less than 15.  UDS: Positive for marijuana   Physician Treatment Plan for Primary Diagnosis: MDD (major depressive disorder), single episode, severe , no psychosis (HCC)   Plan: Medications: --Continue Wellbutrin  XL150 mg p.o. daily for depression --Continue trazodone  tablets 50 mg p.o. daily as needed at bedtime for insomnia --Continue hydroxyzine  tablets 25 mg p.o. 3 times daily as needed for anxiety --Nicotine  patch 21 mg transdermal every 24 hours for smoking cessation   -- [facility] agitation protocol see MAR   Other PRN Medications  -Acetaminophen  650 mg every 6 as needed/mild pain  -Maalox 30 mL oral every 4 as needed/digestion  -Magnesium  hydroxide 30 mL daily as needed/mild constipation    --The risks/benefits/side-effects/alternatives to this medication were discussed in detail with the patient and time was given for questions. The patient consents to medication trial.   -- Metabolic profile and EKG monitoring obtained while on an atypical antipsychotic (BMI: Lipid Panel: HbgA1c: QTc:)   -- Encouraged patient to participate in unit milieu and in scheduled group therapies    Safety and Monitoring:  Voluntary admission to inpatient psychiatric unit for safety, stabilization and treatment  Daily contact with patient to assess and evaluate symptoms and progress in treatment  Patient's case to be discussed in multi-disciplinary team meeting  Observation Level : q15 minute checks  Vital signs: q12 hours  Precautions: suicide, but pt currently verbally contracts for  safety on unit.    Discharge Planning:  Social work and case management to assist with discharge planning and identification of hospital follow-up needs prior to discharge  Estimated LOS: 5-7?days  Discharge Concerns: Need to establisDoes effecxor cause dizziness?  Safety plan; Medication com hi Gail pliance and effectiveness    Discharge Goals: Return home with outpatient referrals for mental health follow-up including medication management/psychotherapy.    Long Term Goal(s): Improvement in symptoms so as ready for discharge   Short Term Goals: Ability to identify changes in lifestyle to reduce recurrence of condition will improve, Ability to verbalize feelings will improve, Ability to disclose and discuss suicidal ideas, Ability to demonstrate self-control will improve, Ability to identify and develop effective coping behaviors will improve, Ability to maintain clinical measurements within normal limits will improve, Compliance with prescribed medications will improve, and Ability to identify triggers associated with substance abuse/mental health issues will improve   Physician Treatment Plan for Secondary Diagnosis:  Principal Problem:   MDD (major depressive disorder), single episode, severe , no psychosis (HCC)   Ellouise JAYSON Azure, FNP 05/31/2024, 2:15 PM

## 2024-05-31 NOTE — Progress Notes (Signed)
   05/30/24 2100  Psych Admission Type (Psych Patients Only)  Admission Status Voluntary  Psychosocial Assessment  Patient Complaints Sadness;Worrying  Eye Contact Fair  Facial Expression Flat  Affect Appropriate to circumstance  Speech Logical/coherent  Interaction Isolative  Motor Activity Slow  Appearance/Hygiene In scrubs  Behavior Characteristics Appropriate to situation  Mood Pleasant  Thought Process  Coherency WDL  Content WDL  Delusions None reported or observed  Perception WDL  Hallucination None reported or observed  Judgment WDL  Confusion None  Danger to Self  Current suicidal ideation?  (Denies)  Agreement Not to Harm Self Yes  Description of Agreement Notify Staff  Danger to Others  Danger to Others None reported or observed

## 2024-05-31 NOTE — Group Note (Signed)
 Date:  05/31/2024 Time:  11:33 AM  Group Topic/Focus:  Goals Group:   The focus of this group is to help patients establish daily goals to achieve during treatment and discuss how the patient can incorporate goal setting into their daily lives to aide in recovery.    Participation Level:  Active  Participation Quality:  Appropriate  Affect:  Appropriate  Cognitive:  Appropriate  Insight: Appropriate  Engagement in Group:  Engaged  Modes of Intervention:  Discussion  Additional Comments:  Pt goal is to meet with doctor. Pt coping skill is to stop take a deep breath and relevate.  Bruce Wells Dawn 05/31/2024, 11:33 AM

## 2024-05-31 NOTE — BH IP Treatment Plan (Signed)
 Interdisciplinary Treatment and Diagnostic Plan Update  05/31/2024 Time of Session: 1040am Bruce Wells MRN: [id number]  Principal Diagnosis: MDD (major depressive disorder), single episode, severe , no psychosis (HCC)  Secondary Diagnoses: Principal Problem:   MDD (major depressive disorder), single episode, severe , no psychosis (HCC)   Current Medications:  Current Facility-Administered Medications  Medication Dose Route Frequency Provider Last Rate Last Admin   acetaminophen  (TYLENOL ) tablet 650 mg  650 mg Oral Q6H PRN Arloa Suzen RAMAN, NP   650 mg at 05/30/24 1507   alum & mag hydroxide-simeth (MAALOX/MYLANTA) 200-200-20 MG/5ML suspension 30 mL  30 mL Oral Q4H PRN Arloa Suzen RAMAN, NP       buPROPion  (WELLBUTRIN  XL) 24 hr tablet 150 mg  150 mg Oral Daily Ntuen, Tina C, FNP   150 mg at 05/31/24 0915   haloperidol  (HALDOL ) tablet 5 mg  5 mg Oral TID PRN Arloa Suzen RAMAN, NP       And   diphenhydrAMINE  (BENADRYL ) capsule 50 mg  50 mg Oral TID PRN Arloa Suzen RAMAN, NP       haloperidol  lactate (HALDOL ) injection 5 mg  5 mg Intramuscular TID PRN Arloa Suzen RAMAN, NP       And   diphenhydrAMINE  (BENADRYL ) injection 50 mg  50 mg Intramuscular TID PRN Arloa Suzen RAMAN, NP       And   LORazepam  (ATIVAN ) injection 2 mg  2 mg Intramuscular TID PRN Arloa Suzen RAMAN, NP       haloperidol  lactate (HALDOL ) injection 10 mg  10 mg Intramuscular TID PRN Arloa Suzen RAMAN, NP       And   diphenhydrAMINE  (BENADRYL ) injection 50 mg  50 mg Intramuscular TID PRN Arloa Suzen RAMAN, NP       And   LORazepam  (ATIVAN ) injection 2 mg  2 mg Intramuscular TID PRN Arloa Suzen RAMAN, NP       hydrOXYzine  (ATARAX ) tablet 25 mg  25 mg Oral TID PRN Arloa Suzen RAMAN, NP       magnesium  hydroxide (MILK OF MAGNESIA) suspension 30 mL  30 mL Oral Daily PRN Arloa Suzen RAMAN, NP       nicotine  (NICODERM CQ  - dosed in mg/24 hours) patch 21 mg  21 mg Transdermal Q0600 Arloa Suzen RAMAN, NP   21 mg  at 05/31/24 9366   traZODone  (DESYREL ) tablet 50 mg  50 mg Oral QHS PRN Arloa Suzen RAMAN, NP   50 mg at 05/30/24 2102   PTA Medications: Medications Prior to Admission  Medication Sig Dispense Refill Last Dose/Taking   acetaminophen  (TYLENOL ) 500 MG tablet Take 1,000 mg by mouth every 6 (six) hours as needed for pain.       Patient Stressors: Financial difficulties   Occupational concerns    Patient Strengths: Active sense of humor  Communication skills  Work skills   Treatment Modalities: Medication Management, Group therapy, Case management,  1 to 1 session with clinician, Psychoeducation, Recreational therapy.   Physician Treatment Plan for Primary Diagnosis: MDD (major depressive disorder), single episode, severe , no psychosis (HCC) Long Term Goal(s): Improvement in symptoms so as ready for discharge   Short Term Goals: Ability to identify changes in lifestyle to reduce recurrence of condition will improve Ability to verbalize feelings will improve Ability to disclose and discuss suicidal ideas Ability to demonstrate self-control will improve Ability to identify and develop effective coping behaviors will improve Ability to maintain clinical measurements within normal limits will improve Compliance with  prescribed medications will improve Ability to identify triggers associated with substance abuse/mental health issues will improve  Medication Management: Evaluate patient's response, side effects, and tolerance of medication regimen.  Therapeutic Interventions: 1 to 1 sessions, Unit Group sessions and Medication administration.  Evaluation of Outcomes: Progressing  Physician Treatment Plan for Secondary Diagnosis: Principal Problem:   MDD (major depressive disorder), single episode, severe , no psychosis (HCC)  Long Term Goal(s): Improvement in symptoms so as ready for discharge   Short Term Goals: Ability to identify changes in lifestyle to reduce recurrence of  condition will improve Ability to verbalize feelings will improve Ability to disclose and discuss suicidal ideas Ability to demonstrate self-control will improve Ability to identify and develop effective coping behaviors will improve Ability to maintain clinical measurements within normal limits will improve Compliance with prescribed medications will improve Ability to identify triggers associated with substance abuse/mental health issues will improve     Medication Management: Evaluate patient's response, side effects, and tolerance of medication regimen.  Therapeutic Interventions: 1 to 1 sessions, Unit Group sessions and Medication administration.  Evaluation of Outcomes: Progressing   RN Treatment Plan for Primary Diagnosis: MDD (major depressive disorder), single episode, severe , no psychosis (HCC) Long Term Goal(s): Knowledge of disease and therapeutic regimen to maintain health will improve  Short Term Goals: Ability to participate in decision making will improve, Ability to verbalize feelings will improve, Ability to disclose and discuss suicidal ideas, and Ability to identify and develop effective coping behaviors will improve  Medication Management: RN will administer medications as ordered by provider, will assess and evaluate patient's response and provide education to patient for prescribed medication. RN will report any adverse and/or side effects to prescribing provider.  Therapeutic Interventions: 1 on 1 counseling sessions, Psychoeducation, Medication administration, Evaluate responses to treatment, Monitor vital signs and CBGs as ordered, Perform/monitor CIWA, COWS, AIMS and Fall Risk screenings as ordered, Perform wound care treatments as ordered.  Evaluation of Outcomes: Progressing   LCSW Treatment Plan for Primary Diagnosis: MDD (major depressive disorder), single episode, severe , no psychosis (HCC) Long Term Goal(s): Safe transition to appropriate next level of  care at discharge, Engage patient in therapeutic group addressing interpersonal concerns.  Short Term Goals: Engage patient in aftercare planning with referrals and resources, Increase social support, Increase ability to appropriately verbalize feelings, Increase emotional regulation, Facilitate acceptance of mental health diagnosis and concerns, and Increase skills for wellness and recovery  Therapeutic Interventions: Assess for all discharge needs, 1 to 1 time with Social worker, Explore available resources and support systems, Assess for adequacy in community support network, Educate family and significant other(s) on suicide prevention, Complete Psychosocial Assessment, Interpersonal group therapy.  Evaluation of Outcomes: Progressing   Progress in Treatment: Attending groups: Yes. Participating in groups: Yes. Taking medication as prescribed: Yes. Toleration medication: Yes. Family/Significant other contact made: No, will contact:  declined consents Patient understands diagnosis: Yes. Discussing patient identified problems/goals with staff: Yes. Medical problems stabilized or resolved: Yes. Denies suicidal/homicidal ideation: Yes. Issues/concerns per patient self-inventory: No.  New problem(s) identified: No, Describe:  none  New Short Term/Long Term Goal(s): medication stabilization, elimination of SI thoughts, development of comprehensive mental wellness plan.    Patient Goals:  Adjust my medications, lower my depression and anxiety which I think the new meds has already done  Discharge Plan or Barriers: Patient recently admitted. CSW will continue to follow and assess for appropriate referrals and possible discharge planning.    Reason for Continuation  of Hospitalization: Anxiety Depression Medication stabilization Suicidal ideation  Estimated Length of Stay: 2-3 days  Last 3 [place] Suicide Severity Risk Score: Flowsheet Row Ad[facility] (Current) [facility] Most recent reading at 05/29/2024  4:58 PM ED from 05/29/2024 in [facility] Emergency Department at [facility] Most recent reading at 05/29/2024  4:56 AM ED from 07/30/2021 in [facility] Emergency Department at [facility] Most recent reading at 07/30/2021  1:55 PM  C-SSRS RISK CATEGORY High Risk High Risk No Risk    Last PHQ 2/9 Scores:     No data to display          Scribe for Treatment Team: Jenkins LULLA Primer, LCSWA 05/31/2024 11:58 AM

## 2024-05-31 NOTE — Plan of Care (Signed)

## 2024-05-31 NOTE — Progress Notes (Signed)
   05/31/24 2010  Psych Admission Type (Psych Patients Only)  Admission Status Voluntary  Psychosocial Assessment  Patient Complaints Sleep disturbance  Eye Contact Fair  Facial Expression Animated  Affect Appropriate to circumstance  Speech Logical/coherent  Interaction Minimal  Motor Activity Unsteady;Slow  Appearance/Hygiene Unremarkable  Behavior Characteristics Appropriate to situation;Calm  Mood Pleasant  Thought Process  Coherency WDL  Content WDL  Delusions None reported or observed  Perception WDL  Hallucination None reported or observed  Judgment Limited  Confusion None  Danger to Self  Current suicidal ideation?  (Denies)  Description of Agreement Notify Staff  Danger to Others  Danger to Others None reported or observed

## 2024-05-31 NOTE — Group Note (Signed)
 Date:  05/31/2024 Time:  6:16 PM  Group Topic/Focus:  Overcoming Stress:   The focus of this group is to define stress and help patients assess their triggers.    Participation Level:  Active  Participation Quality:  Appropriate and Supportive  Affect:  Appropriate  Cognitive:  Alert, Appropriate, and Oriented  Insight: Good  Engagement in Group:  Engaged  Modes of Intervention:  Activity and Discussion    Annalee  Saisha Hogue 05/31/2024, 6:16 PM

## 2024-05-31 NOTE — Group Note (Deleted)
 Date:  05/31/2024 Time:  2:22 PM  Group Topic/Focus:  Wellness Toolbox:   The focus of this group is to discuss various aspects of wellness, balancing those aspects and exploring ways to increase the ability to experience wellness.  Patients will create a wellness toolbox for use upon discharge.     Participation Level:  {B[facility] PARTICIPATION OZCZO:77735}  Participation Quality:  {B[facility] PARTICIPATION QUALITY:22265}  Affect:  {B[facility] AFFECT:22266}  Cognitive:  {BHH COGNITIVE:22267}  Insight: {BHH Insight2:20797}  Engagement in Group:  {BHH ENGAGEMENT IN HMNLE:77731}  Modes of Intervention:  {BHH MODES OF INTERVENTION:22269}  Additional Comments:  ***  Bruce Wells 05/31/2024, 2:22 PM

## 2024-06-01 MED ORDER — DOCUSATE SODIUM 100 MG PO CAPS
100.0000 mg | ORAL_CAPSULE | Freq: Every day | ORAL | Status: DC
  Administered 2024-06-01 – 2024-06-02 (×2): 100 mg via ORAL
  Filled 2024-06-01: qty 1
  Filled 2024-06-01: qty 7
  Filled 2024-06-01: qty 1

## 2024-06-01 NOTE — [facility] Group Notes (Signed)
 Spirituality group facilitated by Elia Rockie Sofia, BCC.  Group Description: Group focused on topic of community. Patients participated in facilitated discussion around topic, connecting with one another around experiences and definitions for community. Group members engaged with visual explorer photos, reflecting on what community looks like for them today. Group engaged in discussion around how their definitions of community are present today in hospital.  Modalities: Psycho-social ed, Adlerian, Narrative, MI  Patient Progress: Ryman attended group and actively engaged and participated in group conversation and activities.

## 2024-06-01 NOTE — BHH Group Notes (Signed)
 Adult Psychoeducational Group Note  Date:  06/01/2024 Time:  9:47 AM  Group Topic/Focus:  Goals Group:   The focus of this group is to help patients establish daily goals to achieve during treatment and discuss how the patient can incorporate goal setting into their daily lives to aide in recovery.  Orientation:   The focus of this group is to educate the patient on the purpose and policies of crisis stabilization and provide a format to answer questions about their admission.  The group details unit policies and expectations of patients while admitted.  Participation Level:  Active  Participation Quality:  Appropriate  Affect:  Appropriate  Cognitive:  Alert  Insight: Appropriate  Engagement in Group:  Engaged  Modes of Intervention:  Orientation  Additional Comments:   Pt attended and participated in orientation/goals group. Pt participated in icebreaker activity. Pt goal for today stay positive and continue to develop discharge plan.  Bruce Wells 06/01/2024, 9:47 AM

## 2024-06-01 NOTE — B[facility] Group Notes (Signed)
 Psychoeducational Group Note  Date:  06/01/2024 Time:  2000  Group Topic/Focus:  Wrap up group  Participation Level: Did Not Attend  Participation Quality:  Not Applicable  Affect:  Not Applicable  Cognitive:  Not Applicable  Insight:  Not Applicable  Engagement in Group: Not Applicable  Additional Comments:  Did not attend.   Lenora Shaver S 06/01/2024, 9:01 PM

## 2024-06-01 NOTE — B[facility] Suicide Risk Assessment (Signed)
 [facility] INPATIENT:  Family/Significant Other Suicide Prevention Education  Suicide Prevention Education:  Patient Refusal for Family/Significant Other Suicide Prevention Education: The patient Bruce Wells has refused to provide written consent for family/significant other to be provided Family/Significant Other Suicide Prevention Education during admission and/or prior to discharge.  Physician notified.  Patient reported not having any family/friends he wished to be contacted regarding suicide prevention education.  Belma Dyches M Xiadani Damman, LCSWA 06/01/2024, 8:11 AM

## 2024-06-01 NOTE — Progress Notes (Signed)
 Patient ID: Bruce Wells, male   DOB: 18-Feb-1966, 58 y.o.   MRN: [id number] [facility] MD Progress Note  06/01/2024 10:28 AM JA OHMAN  MRN:  [id number]    Principal Problem: MDD (major depressive disorder), single episode, severe , no psychosis (HCC) Diagnosis: Principal Problem:   MDD (major depressive disorder), single episode, severe , no psychosis (HCC)  Reason for admission:  This is the first admission to this [facility] for this unfortunate 58 year old Caucasian male with prior psychiatric diagnoses significant for MDD recurrent severe, and multiple bereavements.  Patient presents voluntarily to [facility] from [place], ED for worsening depression resulting in overdose with 100 pills of Benadryl , attempt to hang himself/electrocute himself in the context of pervasive depression.  BAL: Less than 15.  UDS: Positive for marijuana.  24-hour Chart Review: Patient case discussed in the interdisciplinary team meeting.  Vital signs without critical values.  PRNs of Melatonin x 1 for insomnia.  Today's assessment notes: On assessment today, the pt reports that their mood is euthymic, improved since admission, and stable. Denies feeling down, depressed, or sad. Reports, I have a better outlook today, though my financial problems are still out there waiting for me. The antidepressant is working for my mood, and I do not have any reaction to it.  Patient denies any side effects from the psychotropic medication.  Chart reviewed and findings shared with the treatment team and consults with attending psychiatrist, with recommendation to continue current treatment plan and possible discharge tomorrow 06/02/2024.  He denies delusional thinking or paranoia.  Further denies SI, HI, AVH.  Observed attending and participating therapeutic milieu and unit group activities. Pt. interacting well with other patients on the unit.  Reports that anxiety symptoms are at manageable level.  Sleep  is stable and nursing staff reports pt. Sleeping 7.25 hours last night and feeling restful.  Appetite is stable.  Concentration is without complaint.  Energy level is adequate. Denies having any suicidal thoughts. Denies having any suicidal intent and plan.  Denies having any HI.  Denies having psychotic symptoms.   Denies having side effects to current psychiatric medications.   Discussed discharge planning: How to identify the signs of impending crisis, use of internal coping strategies, reaching out to friends and family that can help navigate a crisis, and a list of mental health professionals and agencies to call. Further to follow up on her mental health appointments and her PCP appointments.   Denies having side effects to current psychiatric medications.   We discussed compliance to current medication regimen.    Time spent with patient: 45 minutes  Past Psychiatric History: Previous Psych Diagnoses: Depression and anxiety Prior inpatient treatment: Denies Current/prior outpatient treatment:denies Prior rehab hx: denies Psychotherapy hx: Denies History of suicide: Denies History of homicide or aggression: Denies Psychiatric medication history: Denies Psychiatric medication compliance history: Denies taking any psychotropic medications Neuromodulation history: Denies Current Psychiatrist: Denies Current therapist: Denies   Past Medical History:  Past Medical History:  Diagnosis Date   Seizures (HCC)    Viral meningitis    History reviewed. No pertinent surgical history. Family History: History reviewed. No pertinent family history. Family Psychiatric  History: See H&P Social History:  Social History   Substance and Sexual Activity  Alcohol Use No     Social History   Substance and Sexual Activity  Drug Use Not Currently   Types: Marijuana    Social History   Socioeconomic History   Marital status:  Single    Spouse name: Not on file   Number of children:  Not on file   Years of education: Not on file   Highest education level: Not on file  Occupational History   Not on file  Tobacco Use   Smoking status: Every Day    Types: Cigars   Smokeless tobacco: Never  Vaping Use   Vaping status: Never Used  Substance and Sexual Activity   Alcohol use: No   Drug use: Not Currently    Types: Marijuana   Sexual activity: Not Currently  Other Topics Concern   Not on file  Social History Narrative   Not on file   Social Drivers of Health   Financial Resource Strain: Not on file  Food Insecurity: Food Insecurity Present (05/29/2024)   Hunger Vital Sign    Worried About Running Out of Food in the Last Year: Often true    Ran Out of Food in the Last Year: Often true  Transportation Needs: Unmet Transportation Needs (05/29/2024)   PRAPARE - Administrator, Civil Service (Medical): Yes    Lack of Transportation (Non-Medical): Yes  Physical Activity: Not on file  Stress: Not on file  Social Connections: Not on file   Additional Social History:     Sleep: Good Estimated Sleeping Duration (Last 24 Hours): 5.75-7.00 hours  Appetite:  Good  Current Medications: Current Facility-Administered Medications  Medication Dose Route Frequency Provider Last Rate Last Admin   acetaminophen  (TYLENOL ) tablet 650 mg  650 mg Oral Q6H PRN Arloa Suzen RAMAN, NP   650 mg at 05/30/24 1507   alum & mag hydroxide-simeth (MAALOX/MYLANTA) 200-200-20 MG/5ML suspension 30 mL  30 mL Oral Q4H PRN Arloa Suzen RAMAN, NP       buPROPion  (WELLBUTRIN  XL) 24 hr tablet 150 mg  150 mg Oral Daily Ralpheal Zappone C, FNP   150 mg at 06/01/24 0734   haloperidol  (HALDOL ) tablet 5 mg  5 mg Oral TID PRN Arloa Suzen RAMAN, NP       And   diphenhydrAMINE  (BENADRYL ) capsule 50 mg  50 mg Oral TID PRN Arloa Suzen RAMAN, NP       haloperidol  lactate (HALDOL ) injection 5 mg  5 mg Intramuscular TID PRN Arloa Suzen RAMAN, NP       And   diphenhydrAMINE  (BENADRYL ) injection 50  mg  50 mg Intramuscular TID PRN Arloa Suzen RAMAN, NP       And   LORazepam  (ATIVAN ) injection 2 mg  2 mg Intramuscular TID PRN Arloa Suzen RAMAN, NP       haloperidol  lactate (HALDOL ) injection 10 mg  10 mg Intramuscular TID PRN Arloa Suzen RAMAN, NP       And   diphenhydrAMINE  (BENADRYL ) injection 50 mg  50 mg Intramuscular TID PRN Arloa Suzen RAMAN, NP       And   LORazepam  (ATIVAN ) injection 2 mg  2 mg Intramuscular TID PRN Arloa Suzen RAMAN, NP       hydrOXYzine  (ATARAX ) tablet 25 mg  25 mg Oral TID PRN Arloa Suzen RAMAN, NP       ibuprofen  (ADVIL ) tablet 600 mg  600 mg Oral Q6H PRN Rhealyn Cullen C, FNP       magnesium  hydroxide (MILK OF MAGNESIA) suspension 30 mL  30 mL Oral Daily PRN Arloa Suzen RAMAN, NP       melatonin tablet 3 mg  3 mg Oral QHS PRN Jin Shockley C, FNP   3  mg at 05/31/24 2107   nicotine  (NICODERM CQ  - dosed in mg/24 hours) patch 21 mg  21 mg Transdermal Q0600 Arloa Suzen RAMAN, NP   21 mg at 06/01/24 9367   Lab Results:  Results for orders placed or performed during the hospital encounter of 05/29/24 (from the past 48 hours)  TSH     Status: None   Collection Time: 05/30/24  6:20 PM  Result Value Ref Range   TSH 1.171 0.350 - 4.500 uIU/mL    Comment: Performed by a 3rd Generation assay with a functional sensitivity of <=0.01 uIU/mL. Performed at [facility], [address] [place], [place] [address]   VITAMIN D  25 Hydroxy (Vit-D Deficiency, Fractures)     Status: Abnormal   Collection Time: 05/30/24  6:20 PM  Result Value Ref Range   Vit D, 25-Hydroxy 11.54 (L) 30 - 100 ng/mL    Comment: (NOTE) Vitamin D  deficiency has been defined by the Institute of Medicine  and an Endocrine Society practice guideline as a level of serum 25-OH  vitamin D  less than 20 ng/mL (1,2). The Endocrine Society went on to  further define vitamin D  insufficiency as a level between 21 and 29  ng/mL (2).  1. IOM (Institute of Medicine). 2010. Dietary  reference intakes for  calcium and D. [place]: [employer]. 2. Holick MF, Binkley Oliver, Bischoff-Ferrari HA, et al. Evaluation,  treatment, and prevention of vitamin D  deficiency: an Endocrine  Society clinical practice guideline, JCEM. 2011 Jul; 96(7): 1911-30.  Performed at [facility], [address] [place], [place] [address]   Vitamin B12     Status: Abnormal   Collection Time: 05/30/24  6:20 PM  Result Value Ref Range   Vitamin B-12 159 (L) 180 - 914 pg/mL    Comment: (NOTE) This assay is not validated for testing neonatal or myeloproliferative syndrome specimens for Vitamin B12 levels. Performed at [facility], [address] [place], [place] [address]   Hemoglobin A1c     Status: None   Collection Time: 05/30/24  6:20 PM  Result Value Ref Range   Hgb A1c MFr Bld 5.4 4.8 - 5.6 %    Comment: (NOTE)         Prediabetes: 5.7 - 6.4         Diabetes: >6.4         Glycemic control for adults with diabetes: <7.0    Mean Plasma Glucose 108 mg/dL    Comment: (NOTE) Performed At: [facility] [address] [place], [place] [id number] Jennette Shorter MD Ey:[id number]   RPR     Status: None   Collection Time: 05/30/24  6:20 PM  Result Value Ref Range   RPR Ser Ql NON REACTIVE NON REACTIVE    Comment: Performed at [facility], [address], [place], [place] [address]  HIV Antibody (routine testing w rflx)     Status: None   Collection Time: 05/30/24  6:20 PM  Result Value Ref Range   HIV Screen 4th Generation wRfx Non Reactive Non Reactive    Comment: Performed at [facility], [address] [place], [place] [address]   Blood Alcohol level:  Lab Results  Component Value Date   [facility] <15 05/29/2024   [facility]  03/18/2008    <5        LOWEST DETECTABLE LIMIT FOR SERUM ALCOHOL IS 11 mg/dL FOR MEDICAL PURPOSES ONLY   Metabolic  Disorder Labs: Lab Results  Component Value Date   HGBA1C 5.4 05/30/2024    MPG 108 05/30/2024   MPG 122 03/18/2008   No results found for: PROLACTIN Lab Results  Component Value Date   CHOL  03/19/2008    193        ATP III CLASSIFICATION:  <200     mg/dL   Desirable  799-760  mg/dL   Borderline High  >=759    mg/dL   High   TRIG 726 (H) 93/98/7990   HDL 25 (L) 03/19/2008   CHOLHDL 7.7 03/19/2008   VLDL 55 (H) 03/19/2008   LDLCALC (H) 03/19/2008    113        Total Cholesterol/HDL:CHD Risk Coronary Heart Disease Risk Table                     Men   Women  1/2 Average Risk   3.4   3.3   Physical Findings: AIMS:  ,  ,  ,  ,  ,  ,   CIWA:    COWS:     Musculoskeletal: Strength & Muscle Tone: within normal limits Gait & Station: normal Patient leans: N/A  Psychiatric Specialty Exam:  Presentation  General Appearance:  Appropriate for Environment; Casual  Eye Contact: Good  Speech: Clear and Coherent  Speech Volume: Normal  Handedness: Right  Mood and Affect  Mood: Euthymic  Affect: Congruent  Thought Process  Thought Processes: Coherent  Descriptions of Associations:Intact  Orientation:Full (Time, Place and Person)  Thought Content:Logical  History of Schizophrenia/Schizoaffective disorder:No data recorded Duration of Psychotic Symptoms:No data recorded Hallucinations:Hallucinations: None  Ideas of Reference:None  Suicidal Thoughts:Suicidal Thoughts: No  Homicidal Thoughts:Homicidal Thoughts: No  Sensorium  Memory: Immediate Good; Recent Good  Judgment: Fair  Insight: Fair  Art therapist  Concentration: Good  Attention Span: Good  Recall: Good  Fund of Knowledge: Fair  Language: Good  Psychomotor Activity  Psychomotor Activity: Psychomotor Activity: Normal  Assets  Assets: Communication Skills; Physical Health; Resilience; Social Support  Sleep  Sleep: Sleep: Good Number of Hours of Sleep: 7.25  Physical Exam: Physical Exam Vitals and nursing note reviewed.   Constitutional:      General: He is not in acute distress.    Appearance: He is normal weight. He is not ill-appearing.  HENT:     Head: Normocephalic.     Right Ear: External ear normal.     Left Ear: External ear normal.     Nose: Nose normal.     Mouth/Throat:     Mouth: Mucous membranes are moist.     Pharynx: Oropharynx is clear.  Eyes:     Extraocular Movements: Extraocular movements intact.  Cardiovascular:     Rate and Rhythm: Normal rate.     Pulses: Normal pulses.  Pulmonary:     Effort: Pulmonary effort is normal. No respiratory distress.  Abdominal:     Comments: Deferred  Genitourinary:    Comments: Deferred Musculoskeletal:        General: Normal range of motion.     Cervical back: Normal range of motion.     Comments: Left hip and left shoulder pain sustained during attempt to hang self.  Skin:    General: Skin is warm.  Neurological:     General: No focal deficit present.     Mental Status: He is alert and oriented to person, place, and time.  Psychiatric:        Mood and  Affect: Mood normal.        Behavior: Behavior normal.        Thought Content: Thought content normal.    Review of Systems  Constitutional:  Negative for chills and fever.  HENT:  Negative for sore throat.   Eyes:  Negative for blurred vision.  Respiratory:  Negative for cough, sputum production, shortness of breath and wheezing.   Cardiovascular:  Negative for chest pain and palpitations.  Gastrointestinal:  Negative for abdominal pain, constipation, diarrhea, heartburn, nausea and vomiting.  Genitourinary:  Negative for dysuria, frequency and urgency.  Musculoskeletal:  Negative for falls.  Skin:  Negative for itching and rash.  Neurological:  Negative for dizziness and headaches.  Endo/Heme/Allergies:        See allergy listing  Psychiatric/Behavioral:  Positive for depression (Symptoms Improving). Negative for hallucinations, substance abuse and suicidal ideas. The patient is  nervous/anxious (Improving). The patient does not have insomnia.    Blood pressure 112/73, pulse 67, temperature 98.5 F (36.9 C), temperature source Oral, resp. rate 18, height 5' 9 (1.753 m), weight 95.7 kg, SpO2 98%. Body mass index is 31.16 kg/m.  Treatment Plan Summary: Daily contact with patient to assess and evaluate symptoms and progress in treatment and Medication management Observation Level/Precautions:  15 minute checks  Laboratory:   CBC with differentials: Within normal limits CMP: CO2 19 low, calcium 8.2 low, alkaline phos 34 low, albumin 3.3 low, AST 89 high, total protein 5.9 low, bilirubin 1.3 high, otherwise normal.  Cardiac profile troponin 1 high-sensitivity less than 2.  Lipid profile: Not obtained.  UDS: Positive for marijuana   New labs ordered: Vitamin B-12, vitamin D  25-hydroxy, TSH, lipid panel, hemoglobin A1c, folic acid, UA   EKG reviewed: Sinus rhythm, ventricular rate 75, QT/QTc 384/429  Psychotherapy: Yes  Medications: See MAR  Consultations: Pending  Discharge Concerns: Safety  Estimated LOS: 3 to 7 days  Other:      Assessment: This is the first admission to this [facility] for this unfortunate 58 year old Caucasian male with prior psychiatric diagnoses significant for MDD recurrent severe, multiple bereavement.  Patient presents voluntarily to [facility] from [place], ED for worsening depression resulting in overdose with 100 pills of Benadryl , attempt to hang himself/electrocute himself in the context of pervasive depression.  BAL: Less than 15.  UDS: Positive for marijuana   Physician Treatment Plan for Primary Diagnosis: MDD (major depressive disorder), single episode, severe , no psychosis (HCC)   Plan: Medications: --Continue Wellbutrin  XL150 mg p.o. daily for depression --Continue trazodone  tablets 50 mg p.o. daily as needed at bedtime for insomnia --Continue hydroxyzine  tablets 25 mg p.o. 3 times daily as needed for  anxiety --Nicotine  patch 21 mg transdermal every 24 hours for smoking cessation   -- [facility] agitation protocol see MAR   Other PRN Medications  -Acetaminophen  650 mg every 6 as needed/mild pain  -Maalox 30 mL oral every 4 as needed/digestion  -Magnesium  hydroxide 30 mL daily as needed/mild constipation    --The risks/benefits/side-effects/alternatives to this medication were discussed in detail with the patient and time was given for questions. The patient consents to medication trial.   -- Metabolic profile and EKG monitoring obtained while on an atypical antipsychotic (BMI: Lipid Panel: HbgA1c: QTc:)   -- Encouraged patient to participate in unit milieu and in scheduled group therapies    Safety and Monitoring:  Voluntary admission to inpatient psychiatric unit for safety, stabilization and treatment  Daily contact with patient to  assess and evaluate symptoms and progress in treatment  Patient's case to be discussed in multi-disciplinary team meeting  Observation Level : q15 minute checks  Vital signs: q12 hours  Precautions: suicide, but pt currently verbally contracts for safety on unit.    Discharge Planning:  Social work and case management to assist with discharge planning and identification of hospital follow-up needs prior to discharge  Estimated LOS: 5-7?days  Discharge Concerns: Need to establisDoes effecxor cause dizziness?  Safety plan; Medication com hi Gail pliance and effectiveness    Discharge Goals: Return home with outpatient referrals for mental health follow-up including medication management/psychotherapy.    Long Term Goal(s): Improvement in symptoms so as ready for discharge   Short Term Goals: Ability to identify changes in lifestyle to reduce recurrence of condition will improve, Ability to verbalize feelings will improve, Ability to disclose and discuss suicidal ideas, Ability to demonstrate self-control will improve, Ability to identify and develop effective  coping behaviors will improve, Ability to maintain clinical measurements within normal limits will improve, Compliance with prescribed medications will improve, and Ability to identify triggers associated with substance abuse/mental health issues will improve   Physician Treatment Plan for Secondary Diagnosis: Principal Problem:   MDD (major depressive disorder), single episode, severe , no psychosis (HCC)   Ellouise JAYSON Azure, FNP 06/01/2024, 10:28 AM

## 2024-06-01 NOTE — Plan of Care (Signed)

## 2024-06-01 NOTE — Progress Notes (Signed)
   06/01/24 2025  Psych Admission Type (Psych Patients Only)  Admission Status Voluntary  Psychosocial Assessment  Patient Complaints Depression;Worrying  Eye Contact Fair  Facial Expression Flat  Affect Appropriate to circumstance  Speech Logical/coherent  Interaction Isolative  Motor Activity Slow;Unsteady  Appearance/Hygiene In scrubs  Behavior Characteristics Unwilling to participate  Mood Pleasant  Thought Process  Coherency WDL  Content WDL  Delusions None reported or observed  Perception WDL  Hallucination None reported or observed  Judgment Limited  Confusion None  Danger to Self  Current suicidal ideation?  (Denies)  Description of Agreement Notify Staff  Danger to Others  Danger to Others None reported or observed

## 2024-06-01 NOTE — BH Assessment (Signed)
(  Sleep Hours) - 7.25 (Any PRNs that were needed, meds refused, or side effects to meds)- Melatonin 3 mg PO (Any disturbances and when (visitation, over night)- None (Concerns raised by the patient)- None (SI/HI/AVH)- Denies

## 2024-06-01 NOTE — Plan of Care (Signed)
   Problem: Education: Goal: Emotional status will improve Outcome: Progressing Goal: Mental status will improve Outcome: Progressing

## 2024-06-01 NOTE — Group Note (Signed)
 LCSW Group Therapy Note   Group Date: 06/01/2024 Start Time: 1100 End Time: 1200  Participation:  patient was present and actively participated in the conversation.  He was insightful.  Type of Therapy:  Group Therapy   Topic:  Stress Less:  Nurturing Your Mind and Body Through Calm   Objective:  Learn techniques for managing stress through body relaxation, mindfulness, and self-compassion.  Goals: Use body relaxation techniques, such as Box Breathing and Progressive Muscle Relaxation, to reduce physical tension. Practice mindfulness to break the cycle of overthinking and mental chatter. Embrace self-compassion to handle stress with kindness and resilience.  Summary:  Today's session focused on calming the body with relaxation techniques, breaking the cycle of stress with mindfulness, and using self-compassion to manage challenges more gracefully. These tools help reduce stress and foster a balanced, peaceful mindset.  Therapeutic Modalities used:  Elements of CBT ( cognitive restructuring)  Elements of DBT (box breathing, progressive body relaxation, mindfulness, acceptance)    Bruce Wells O Elya Diloreto, LCSWA 06/01/2024  12:20 PM

## 2024-06-01 NOTE — Progress Notes (Signed)
 Tour of Duty:  Prentice JINNY Angle, RN, 06/01/24, Tour of Duty: 0700-1900  SI/HI/AVH: Denies  Self-Reported   Mood: Positive  Anxiety: Endorses Depression: Denies Irritability: Denies  Broset  Violence Prevention Guidelines *See Row Information*: Small Violence Risk interventions implemented   LBM  Last BM Date : 06/01/24   Pain: present, PRN and/or interventions refused  Patient Refusals (including Rx): No  Shift Summary: Patient observed to be calm on unit. Patient able to make needs known. Patient observed to engage appropriately with staff and peers. Patient taking medications as prescribed. This shift, no PRN medication requested or required. No observed or reported side effects to medication. No observed or reported agitation, aggression, or other acute emotional distress. No observed or reported physical abnormalities or concerns.Patient reports excitement and minor anxiety r/t upcoming discharge.   Last Vitals  Vitals Weight: 95.7 kg Temp: 98.7 F (37.1 C) Temp Source: Oral Pulse Rate: 73 Resp: 16 BP: 118/75 Patient Position: (not recorded)  Admission Type  Psych Admission Type (Psych Patients Only) Admission Status: Voluntary Date 72 hour document signed : (not recorded) Time 72 hour document signed : (not recorded) Provider Notified (First and Last Name) (see details for LINK to note): (not recorded)   Psychosocial Assessment  Psychosocial Assessment Patient Complaints: Anxiety Eye Contact: Fair Facial Expression: Other (Comment) (WDL) Affect: Appropriate to circumstance Speech: Logical/coherent Interaction: Cautious Motor Activity: Slow, Unsteady Appearance/Hygiene: Unremarkable Behavior Characteristics: Anxious, Cooperative Mood: Pleasant   Aggressive Behavior  Targets: (not recorded)   Thought Process  Thought Process Coherency: Within Defined Limits Content: Within Defined Limits Delusions: None reported or observed Perception: Within  Defined Limits Hallucination: None reported or observed Judgment: Limited Confusion: None  Danger to Self/Others  Danger to Self Current suicidal ideation?: Denies Description of Suicide Plan: (not recorded) Self-Injurious Behavior: (not recorded) Agreement Not to Harm Self: (not recorded) Description of Agreement: (not recorded) Danger to Others: None reported or observed

## 2024-06-02 DIAGNOSIS — F322 Major depressive disorder, single episode, severe without psychotic features: Principal | ICD-10-CM

## 2024-06-02 MED ORDER — MELATONIN 3 MG PO TABS: 3.0000 mg | ORAL_TABLET | Freq: Every evening | ORAL | 0 refills | Status: AC | PRN

## 2024-06-02 MED ORDER — DOCUSATE SODIUM 100 MG PO CAPS: 100.0000 mg | ORAL_CAPSULE | Freq: Every day | ORAL | 0 refills | Status: AC

## 2024-06-02 MED ORDER — NICOTINE 21 MG/24HR TD PT24: 21.0000 mg | MEDICATED_PATCH | Freq: Every day | TRANSDERMAL | 0 refills | Status: AC

## 2024-06-02 MED ORDER — HYDROXYZINE HCL 25 MG PO TABS: 25.0000 mg | ORAL_TABLET | Freq: Three times a day (TID) | ORAL | 0 refills | Status: AC | PRN

## 2024-06-02 MED ORDER — IBUPROFEN 600 MG PO TABS: 600.0000 mg | ORAL_TABLET | Freq: Four times a day (QID) | ORAL | 0 refills | Status: AC | PRN

## 2024-06-02 MED ORDER — BUPROPION HCL ER (XL) 150 MG PO TB24: 150.0000 mg | ORAL_TABLET | Freq: Every day | ORAL | 0 refills | Status: AC

## 2024-06-02 NOTE — Progress Notes (Signed)
 Adult Psychoeducational Group Note  Date:  06/02/2024 Time:  10:29 AM  Group Topic/Focus:  Goals Group:   The focus of this group is to help patients establish daily goals to achieve during treatment and discuss how the patient can incorporate goal setting into their daily lives to aide in recovery.  Participation Level:  Active  Participation Quality:  Appropriate  Affect:  Appropriate  Cognitive:  Appropriate  Insight: Appropriate  Engagement in Group:  Engaged  Modes of Intervention:  Discussion  Additional Comments:  Pt stated his goal for the day is to be discharged.  Daine Pillar D 06/02/2024, 10:29 AM

## 2024-06-02 NOTE — Plan of Care (Signed)

## 2024-06-02 NOTE — Progress Notes (Signed)
 Patient discharged off unit at 1305. Patient belongings reviewed and acknowledged by patient. AVS and Transition Record reviewed and acknowledged by patient. Safety plan completed by patient and copy provided. Patient denies SI/HI/AVH. No observed or reported side effects to medication. No observed or reported agitation, aggression, or other acute emotional distress. No observed or reported physical abnormalities or concerns. Patient transportation from facility verified and observed.

## 2024-06-02 NOTE — BH Assessment (Signed)
(  Sleep Hours) - 5.75 (Any PRNs that were needed, meds refused, or side effects to meds)- None (Any disturbances and when (visitation, over night)- None (Concerns raised by the patient)- Pt was alittle down and isolative 7p-7a more than before. Was given in report that he has an overdue amount at his current hotel.  (SI/HI/AVH)- Denies

## 2024-06-02 NOTE — Progress Notes (Signed)
  [facility] Adult Case Management Discharge Plan :  Will you be returning to the same living situation after discharge:  Yes,  pt will be returning to a motel after discharge.  At discharge, do you have transportation home?: Yes,  pt will be transported home via taxi voucher at 1:00PM. Do you have the ability to pay for your medications: No.  Release of information consent forms completed and in the chart;  Patient's signature needed at discharge.  Patient to Follow up at:  Follow-up Information     [facility] Go to.   Specialty: Professional Counselor Why: You may go to this provider for therapy services. Contact information: [address]  [address] [place] [address] [phone]         [facility] Go to.   Specialty: Behavioral Health Why: You may go to this provider for medication management services. Contact information: [address] [place]  [address] [phone]        [facility] Go on 06/12/2024.   Why: You have a hospital follow up appointment for therapy and medication management services on 06/12/24 at 8:30 am.  The appointment will be held in person.  Please bring your photo ID and list of medications/medications with you. Contact information: [address] [place] [address] [phone]                 Next level of care provider has access to [facility] Link:no  Safety Planning and Suicide Prevention discussed: Yes,  completed with patient as he did not give consent to contact family/friends.      Has patient been referred to the Quitline?: Patient refused referral for treatment  Patient has been referred for addiction treatment: Patient refused referral for treatment.  Meshia Rau M Peregrine Nolt, LCSWA 06/02/2024, 10:18 AM

## 2024-06-02 NOTE — Discharge Summary (Signed)
 Physician Discharge Summary Note  Patient:  Bruce Wells is an 58 y.o., male MRN:  [id number] DOB:  1966-02-02 Patient phone:  [phone] (home)  Patient address:   [address] [place] [address],  Total Time spent with patient: 45 minutes  Date of Admission:  05/29/2024 Date of Discharge:   06/02/2024  Reason for Admission:   This is the first admission to this [facility] for this unfortunate 58 year old Caucasian male with prior psychiatric diagnoses significant for MDD recurrent severe, and multiple bereavements.  Patient presents voluntarily to [facility] from [place], ED for worsening depression resulting in overdose with 100 pills of Benadryl , attempt to hang himself/electrocute himself in the context of pervasive depression.  BAL: Less than 15.  UDS: Positive for marijuana.   Principal Problem: MDD (major depressive disorder), single episode, severe , no psychosis (HCC) Discharge Diagnoses: Principal Problem:   MDD (major depressive disorder), single episode, severe , no psychosis (HCC)  Past Psychiatric History: Previous Psych Diagnoses: Depression and anxiety Prior inpatient treatment: Denies Current/prior outpatient treatment:denies Prior rehab hx: denies Psychotherapy hx: Denies History of suicide: Denies History of homicide or aggression: Denies Psychiatric medication history: Denies Psychiatric medication compliance history: Denies taking any psychotropic medications Neuromodulation history: Denies Current Psychiatrist: Denies Current therapist: Denies   Past Medical History:  Past Medical History:  Diagnosis Date   Seizures (HCC)    Viral meningitis    History reviewed. No pertinent surgical history. Family History: History reviewed. No pertinent family history. Family Psychiatric  History: See H&P Social History:  Social History   Substance and Sexual Activity  Alcohol Use No     Social History   Substance and  Sexual Activity  Drug Use Not Currently   Types: Marijuana    Social History   Socioeconomic History   Marital status: Single    Spouse name: Not on file   Number of children: Not on file   Years of education: Not on file   Highest education level: Not on file  Occupational History   Not on file  Tobacco Use   Smoking status: Every Day    Types: Cigars   Smokeless tobacco: Never  Vaping Use   Vaping status: Never Used  Substance and Sexual Activity   Alcohol use: No   Drug use: Not Currently    Types: Marijuana   Sexual activity: Not Currently  Other Topics Concern   Not on file  Social History Narrative   Not on file   Social Drivers of Health   Financial Resource Strain: Not on file  Food Insecurity: Food Insecurity Present (05/29/2024)   Hunger Vital Sign    Worried About Running Out of Food in the Last Year: Often true    Ran Out of Food in the Last Year: Often true  Transportation Needs: Unmet Transportation Needs (05/29/2024)   PRAPARE - Administrator, Civil Service (Medical): Yes    Lack of Transportation (Non-Medical): Yes  Physical Activity: Not on file  Stress: Not on file  Social Connections: Not on file   Hospital Course:  During the patient's hospitalization, patient had extensive initial psychiatric evaluation, and follow-up psychiatric evaluations every day.  Psychiatric diagnoses provided upon initial assessment:  Diagnosis:  Principal Problem:   MDD (major depressive disorder), single episode, severe , no psychosis (HCC)  Patient's psychiatric medications were adjusted on admission:  -Initiate Wellbutrin  XL150 mg p.o. daily for depression --Continue trazodone  tablets 50  mg p.o. daily as needed at bedtime for insomnia --Continue hydroxyzine  tablets 25 mg p.o. 3 times daily as needed for anxiety --Nicotine  patch 21 mg transdermal every 24 hours for smoking cessation  During the hospitalization, other adjustments were made to the  patient's psychiatric medication regimen: None  Patient's care was discussed during the interdisciplinary team meeting every day during the hospitalization.  The patient denies having side effects to prescribed psychiatric medication.  Gradually, patient started adjusting to milieu. The patient was evaluated each day by a clinical provider to ascertain response to treatment. Improvement was noted by the patient's report of decreasing symptoms, improved sleep and appetite, affect, medication tolerance, behavior, and participation in unit programming.  Patient was asked each day to complete a self inventory noting mood, mental status, pain, new symptoms, anxiety and concerns.    Symptoms were reported as significantly decreased or resolved completely by discharge.   On day of discharge, the patient reports that their mood is stable. The patient denied having suicidal thoughts for more than 48 hours prior to discharge.  Patient denies having homicidal thoughts.  Patient denies having auditory hallucinations.  Patient denies any visual hallucinations or other symptoms of psychosis. The patient was motivated to continue taking medication with a goal of continued improvement in mental health.   The patient reports their target psychiatric symptoms of depression responded well to the psychiatric medications, and the patient reports overall benefit other psychiatric hospitalization. Supportive psychotherapy was provided to the patient. The patient also participated in regular group therapy while hospitalized. Coping skills, problem solving as well as relaxation therapies were also part of the unit programming.  Labs were reviewed with the patient, and abnormal results were discussed with the patient.  The patient is able to verbalize their individual safety plan to this provider.  # It is recommended to the patient to continue psychiatric medications as prescribed, after discharge from the hospital.    #  It is recommended to the patient to follow up with your outpatient psychiatric provider and PCP.  # It was discussed with the patient, the impact of alcohol, drugs, tobacco have been there overall psychiatric and medical wellbeing, and total abstinence from substance use was recommended the patient.  # Prescriptions provided or sent directly to preferred pharmacy at discharge. Patient agreeable to plan. Given opportunity to ask questions. Appears to feel comfortable with discharge.    # In the event of worsening symptoms, the patient is instructed to call the crisis hotline, 911 and or go to the nearest ED for appropriate evaluation and treatment of symptoms. To follow-up with primary care provider for other medical issues, concerns and or health care needs  # Patient was discharged home with a plan to follow up as noted below.   Physical Findings: AIMS:  , ,  ,  ,  ,  ,   CIWA:    COWS:     Musculoskeletal: Strength & Muscle Tone: within normal limits Gait & Station: normal Patient leans: N/A  Psychiatric Specialty Exam:  Presentation  General Appearance:  Appropriate for Environment; Casual  Eye Contact: Good  Speech: Clear and Coherent  Speech Volume: Normal  Handedness: Right  Mood and Affect  Mood: Euthymic  Affect: Congruent  Thought Process  Thought Processes: Coherent  Descriptions of Associations:Intact  Orientation:Full (Time, Place and Person)  Thought Content:Logical  History of Schizophrenia/Schizoaffective disorder:No data recorded Duration of Psychotic Symptoms:No data recorded Hallucinations:Hallucinations: None  Ideas of Reference:None  Suicidal Thoughts:Suicidal Thoughts:  No  Homicidal Thoughts:Homicidal Thoughts: No  Sensorium  Memory: Immediate Good; Recent Good  Judgment: Fair  Insight: Fair  Executive Functions  Concentration: Good  Attention Span: Good  Recall: Good  Fund of  Knowledge: Fair  Language: Good  Psychomotor Activity  Psychomotor Activity: Psychomotor Activity: Normal  Assets  Assets: Communication Skills; Desire for Improvement; Physical Health; Resilience; Social Support  Sleep  Sleep: Sleep: Good  Estimated Sleeping Duration (Last 24 Hours): 4.50-5.75 hours  Physical Exam: Physical Exam Vitals and nursing note reviewed.  Constitutional:      General: He is not in acute distress.    Appearance: He is normal weight. He is not ill-appearing.  HENT:     Head: Normocephalic.     Right Ear: External ear normal.     Left Ear: External ear normal.     Nose: Nose normal.     Mouth/Throat:     Mouth: Mucous membranes are moist.     Pharynx: Oropharynx is clear.  Eyes:     Extraocular Movements: Extraocular movements intact.  Cardiovascular:     Rate and Rhythm: Normal rate.     Pulses: Normal pulses.  Pulmonary:     Effort: Pulmonary effort is normal. No respiratory distress.  Abdominal:     Comments: Deferred  Genitourinary:    Comments: Deferred  Musculoskeletal:        General: Normal range of motion.     Cervical back: Normal range of motion.  Skin:    General: Skin is warm.  Neurological:     General: No focal deficit present.     Mental Status: He is oriented to person, place, and time.  Psychiatric:        Mood and Affect: Mood normal.        Behavior: Behavior normal.        Thought Content: Thought content normal.    Review of Systems  Constitutional:  Negative for chills and fever.  HENT:  Negative for sore throat.   Eyes:  Negative for blurred vision.  Respiratory:  Negative for cough, sputum production, shortness of breath and wheezing.   Cardiovascular:  Negative for chest pain and palpitations.  Gastrointestinal:  Negative for abdominal pain, constipation, diarrhea, heartburn, nausea and vomiting.  Genitourinary:  Negative for dysuria.  Musculoskeletal:  Negative for falls.  Skin:  Negative for  itching and rash.  Neurological:  Negative for dizziness and headaches.  Endo/Heme/Allergies:        See allergy listing  Psychiatric/Behavioral:  Positive for depression (Stable with medication). Negative for hallucinations, substance abuse and suicidal ideas. The patient is nervous/anxious (Improved with medication). The patient does not have insomnia.    Blood pressure 136/83, pulse 73, temperature 98.7 F (37.1 C), temperature source Oral, resp. rate 16, height 5' 9 (1.753 m), weight 95.7 kg, SpO2 98%. Body mass index is 31.16 kg/m.  Social History   Tobacco Use  Smoking Status Every Day   Types: Cigars  Smokeless Tobacco Never   Tobacco Cessation:  A prescription for an FDA-approved tobacco cessation medication provided at discharge  Blood Alcohol level:  Lab Results  Component Value Date   [facility] <15 05/29/2024   [facility]  03/18/2008    <5        LOWEST DETECTABLE LIMIT FOR SERUM ALCOHOL IS 11 mg/dL FOR MEDICAL PURPOSES ONLY   Metabolic Disorder Labs:  Lab Results  Component Value Date   HGBA1C 5.4 05/30/2024   MPG 108 05/30/2024   MPG  122 03/18/2008   No results found for: PROLACTIN Lab Results  Component Value Date   CHOL  03/19/2008    193        ATP III CLASSIFICATION:  <200     mg/dL   Desirable  799-760  mg/dL   Borderline High  >=759    mg/dL   High   TRIG 726 (H) 93/98/7990   HDL 25 (L) 03/19/2008   CHOLHDL 7.7 03/19/2008   VLDL 55 (H) 03/19/2008   LDLCALC (H) 03/19/2008    113        Total Cholesterol/HDL:CHD Risk Coronary Heart Disease Risk Table                     Men   Women  1/2 Average Risk   3.4   3.3   See Psychiatric Specialty Exam and Suicide Risk Assessment completed by Attending Physician prior to discharge.  Discharge destination:  Home  Is patient on multiple antipsychotic therapies at discharge:  No   Has Patient had three or more failed trials of antipsychotic monotherapy by history:  No  Recommended Plan for Multiple  Antipsychotic Therapies: NA  Discharge Instructions     Increase activity slowly   Complete by: As directed       Allergies as of 06/02/2024       Reactions   Sulfa Antibiotics Anaphylaxis        Medication List     STOP taking these medications    acetaminophen  500 MG tablet Commonly known as: TYLENOL        TAKE these medications      Indication  buPROPion  150 MG 24 hr tablet Commonly known as: WELLBUTRIN  XL Take 1 tablet (150 mg total) by mouth daily. Start taking on: June 03, 2024  Indication: Major Depressive Disorder   docusate sodium  100 MG capsule Commonly known as: COLACE Take 1 capsule (100 mg total) by mouth daily. Start taking on: June 03, 2024  Indication: Constipation   hydrOXYzine  25 MG tablet Commonly known as: ATARAX  Take 1 tablet (25 mg total) by mouth 3 (three) times daily as needed for anxiety.  Indication: Feeling Anxious   ibuprofen  600 MG tablet Commonly known as: ADVIL  Take 1 tablet (600 mg total) by mouth every 6 (six) hours as needed for moderate pain (pain score 4-6).  Indication: Pain   melatonin 3 MG Tabs tablet Take 1 tablet (3 mg total) by mouth at bedtime as needed.  Indication: Trouble Sleeping   nicotine  21 mg/24hr patch Commonly known as: NICODERM CQ  - dosed in mg/24 hours Place 1 patch (21 mg total) onto the skin daily at 6 (six) AM. Start taking on: June 03, 2024  Indication: Nicotine  Addiction        Follow-up Information     [facility] Go to.   Specialty: Professional Counselor Why: You may go to this provider for therapy services. Contact information: [address]  [address] [place] [address] [phone]         [facility] Go to.   Specialty: Behavioral Health Why: You may go to this provider for medication management services. Contact information: [address] [place]  [address] [phone]        [facility] Go on 06/12/2024.   Why: You have a hospital follow up appointment for therapy and medication management services on 06/12/24 at 8:30 am.  The appointment will be held in person.  Please bring your photo ID and list of medications/medications with you. Contact information: [address] [place] [address] [phone]                Follow-up recommendations:   Discharge Recommendations:    The patient is being discharged to home.   Patient is to take his discharge medications as ordered. ?See follow up above.   We recommend that he participates in individual therapy to target uncontrollable agitation and substance abuse.    We recommend that he participates in therapy to target personal conflict, to improve communication skills and conflict resolution skills. Patient is to initiate/implement a contingency based behavioral model to address his behavior.   We recommend that he gets AIMS scale, height, weight, blood pressure, fasting lipid panel, fasting blood sugar in three months from discharge if he's on atypical antipsychotics.    Patient will benefit from monitoring of recurrent suicidal ideation since patient is on antidepressant medication.   The patient should abstain from all illicit substances and alcohol.   If the patient's symptoms worsen or do not continue to improve or if the patient becomes actively suicidal or homicidal then it is recommended that the patient return to the closest hospital emergency room or call 911 for further evaluation and treatment. National Suicide Prevention Lifeline 1800-SUICIDE or 737-487-4630.   Please follow up with your primary medical doctor for all other medical needs.    The patient has been educated on the possible side effects to medications and she/her guardian is to contact a medical professional and inform outpatient provider of any new side effects of medication.   He is to take regular diet and activity  as tolerated. ?Will benefit from moderate daily exercise.   Patient and Family was educated about removing/locking any firearms, medications or dangerous products from the home.   Activity:  As tolerated   Diet:  Regular Diet   Signed:  Ellouise JAYSON Azure, FNP 06/02/2024, 9:26 AM

## 2024-06-02 NOTE — BHH Suicide Risk Assessment (Signed)
 [facility] Discharge Suicide Risk Assessment   Principal Problem: MDD (major depressive disorder), single episode, severe , no psychosis (HCC) Discharge Diagnoses: Principal Problem:   MDD (major depressive disorder), single episode, severe , no psychosis (HCC)   Total Time spent with patient: 30 minutes  Musculoskeletal: Strength & Muscle Tone: within normal limits Gait & Station: normal Patient leans: N/A  Psychiatric Specialty Exam  Presentation  General Appearance and behavior:  Casually dressed, not in any distress, appropriate behavior, engaged politely.  No EPS.  Eye Contact: Good.  Speech: Spontaneous.  Normal rate, tone and volume.  Normal prosody of speech.  Mood and Affect  Mood: Euthymic.  Affect: Full range and appropriate.  Thought Process  Thought Processes: Linear and goal directed.  Descriptions of Associations:Intact  Orientation:Full (Time, Place and Person)  Thought Content: Future oriented.  No current suicidal thoughts.  No homicidal thoughts.  No thoughts of violence.  No negative ruminative flooding.  No guilty ruminations.  No delusional theme.  No obsessions.  Hallucinations: No hallucination in any modality.  Sensorium  Memory: Good.  Judgment: Good.  Insight: Good  Executive Functions  Concentration: Good.  Attention Span: Good.  Recall: Good.  Fund of Knowledge: Good.  Language: Good   Psychomotor Activity  Normal psychomotor activity    Assets  Assets: Communication Skills; Desire for Improvement; Physical Health; Resilience; Social Support   Sleep  Sleep: Sleep: Good  Estimated Sleeping Duration (Last 24 Hours): 4.50-5.75 hours  Physical Exam: Physical Exam ROS Blood pressure 136/83, pulse 73, temperature 98.7 F (37.1 C), temperature source Oral, resp. rate 16, height 5' 9 (1.753 m), weight 95.7 kg, SpO2 98%. Body mass index is 31.16 kg/m.  Mental Status Per Nursing Assessment::   On  Admission:  Suicidal ideation indicated by patient  Demographic Factors:  Male  Loss Factors: NA  Historical Factors: Family history of mental illness or substance abuse and Impulsivity  Risk Reduction Factors:   Positive social support, Positive therapeutic relationship, and Positive coping skills or problem solving skills  Continued Clinical Symptoms:  No residual symptoms of depression.  No evidence of mania.  No evidence of psychosis.  Cognitive Features That Contribute To Risk:  None    Suicide Risk:  Minimal: No current suicidal thoughts.  No current homicidal thoughts.  No current thoughts of violence.  Modifiable risk factor targeted during this admission as mood disorder.  He is doing well on medication.  He is future oriented.  No new psychosocial stressors. Patient is stable for care at the lower setting.  Follow-up Information     [facility] Go to.   Specialty: Professional Counselor Why: You may go to this provider for therapy services. Contact information: [address]  [address] [place] [address] [phone]         [facility]. Go to.   Specialty: Behavioral Health Why: You may go to this provider for medication management services. Contact information: [address] [place]  [phone]        [facility] Go on 06/12/2024.   Why: You have a hospital follow up appointment for therapy and medication management services on 06/12/24 at 8:30 am.  The appointment will be held in person.  Please bring your photo ID and list of medications/medications with you. Contact information: [address] [place] [address] [phone]                 Plan  Of Care/Follow-up recommendations:  See discharge summary  Jerrell DELENA Forehand, MD 06/02/2024, 10:00 AM

## 2024-06-02 NOTE — Plan of Care (Signed)
